# Patient Record
Sex: Female | Born: 1980 | Race: White | Hispanic: No | Marital: Married | Smoking: Former smoker
Health system: Southern US, Community
[De-identification: ages and names within clinical notes are randomized; demographics above are authoritative.]

## PROBLEM LIST (undated history)

## (undated) ENCOUNTER — Inpatient Hospital Stay (HOSPITAL_COMMUNITY): Payer: Self-pay

## (undated) DIAGNOSIS — K219 Gastro-esophageal reflux disease without esophagitis: Secondary | ICD-10-CM

## (undated) DIAGNOSIS — E7800 Pure hypercholesterolemia, unspecified: Secondary | ICD-10-CM

## (undated) DIAGNOSIS — F32A Depression, unspecified: Secondary | ICD-10-CM

## (undated) DIAGNOSIS — O24419 Gestational diabetes mellitus in pregnancy, unspecified control: Secondary | ICD-10-CM

## (undated) DIAGNOSIS — I10 Essential (primary) hypertension: Secondary | ICD-10-CM

## (undated) DIAGNOSIS — E669 Obesity, unspecified: Secondary | ICD-10-CM

## (undated) DIAGNOSIS — F419 Anxiety disorder, unspecified: Secondary | ICD-10-CM

## (undated) DIAGNOSIS — F329 Major depressive disorder, single episode, unspecified: Secondary | ICD-10-CM

## (undated) DIAGNOSIS — J45909 Unspecified asthma, uncomplicated: Secondary | ICD-10-CM

## (undated) DIAGNOSIS — E119 Type 2 diabetes mellitus without complications: Secondary | ICD-10-CM

## (undated) HISTORY — PX: TONSILLECTOMY: SUR1361

## (undated) HISTORY — DX: Unspecified asthma, uncomplicated: J45.909

## (undated) HISTORY — PX: WISDOM TOOTH EXTRACTION: SHX21

---

## 2002-01-05 ENCOUNTER — Emergency Department (HOSPITAL_COMMUNITY): Admission: EM | Admit: 2002-01-05 | Discharge: 2002-01-05 | Payer: Self-pay | Admitting: Emergency Medicine

## 2002-01-05 ENCOUNTER — Encounter: Payer: Self-pay | Admitting: Emergency Medicine

## 2005-04-12 ENCOUNTER — Emergency Department: Payer: Self-pay | Admitting: Emergency Medicine

## 2005-10-08 ENCOUNTER — Observation Stay: Payer: Self-pay | Admitting: Obstetrics and Gynecology

## 2005-10-13 ENCOUNTER — Observation Stay: Payer: Self-pay | Admitting: Obstetrics and Gynecology

## 2005-10-16 ENCOUNTER — Observation Stay: Payer: Self-pay | Admitting: Obstetrics and Gynecology

## 2005-11-07 ENCOUNTER — Inpatient Hospital Stay: Payer: Self-pay | Admitting: Obstetrics and Gynecology

## 2006-04-03 ENCOUNTER — Emergency Department: Payer: Self-pay | Admitting: Unknown Physician Specialty

## 2006-04-03 ENCOUNTER — Other Ambulatory Visit: Payer: Self-pay

## 2006-06-15 ENCOUNTER — Ambulatory Visit: Payer: Self-pay | Admitting: Dermatology

## 2010-03-01 ENCOUNTER — Emergency Department: Payer: Self-pay

## 2012-01-19 ENCOUNTER — Emergency Department: Payer: Self-pay | Admitting: Emergency Medicine

## 2012-07-29 ENCOUNTER — Emergency Department: Payer: Self-pay | Admitting: Emergency Medicine

## 2012-07-29 LAB — COMPREHENSIVE METABOLIC PANEL
Anion Gap: 9 (ref 7–16)
BUN: 10 mg/dL (ref 7–18)
Calcium, Total: 8.5 mg/dL (ref 8.5–10.1)
Chloride: 105 mmol/L (ref 98–107)
Creatinine: 0.77 mg/dL (ref 0.60–1.30)
EGFR (African American): >60
EGFR (Non-African Amer.): >60
Glucose: 184 mg/dL — ABNORMAL HIGH (ref 65–99)
Potassium: 4 mmol/L (ref 3.5–5.1)
SGOT(AST): 58 U/L — ABNORMAL HIGH (ref 15–37)
Sodium: 137 mmol/L (ref 136–145)
Total Protein: 7.1 g/dL (ref 6.4–8.2)

## 2012-07-29 LAB — CBC
HCT: 41 % (ref 35.0–47.0)
HGB: 14.2 g/dL (ref 12.0–16.0)
MCHC: 34.5 g/dL (ref 32.0–36.0)
MCV: 83 fL (ref 80–100)
Platelet: 257 10*3/uL (ref 150–440)
RBC: 4.96 10*6/uL (ref 3.80–5.20)
RDW: 13.6 % (ref 11.5–14.5)

## 2012-07-29 LAB — URINALYSIS, COMPLETE
Bacteria: NONE SEEN
Bilirubin,UR: NEGATIVE
Glucose,UR: 150 mg/dL (ref 0–75)
Ketone: NEGATIVE
Leukocyte Esterase: NEGATIVE
Ph: 5 (ref 4.5–8.0)
RBC,UR: <1 /HPF (ref 0–5)
Specific Gravity: 1.019 (ref 1.003–1.030)
Squamous Epithelial: NONE SEEN
Transitional Epi: <1
WBC UR: NONE SEEN /HPF (ref 0–5)

## 2012-07-29 LAB — PREGNANCY, URINE: Pregnancy Test, Urine: NEGATIVE m[IU]/mL

## 2012-07-29 LAB — CK: CK, Total: 45 U/L (ref 21–215)

## 2013-07-10 ENCOUNTER — Emergency Department (HOSPITAL_COMMUNITY)
Admission: EM | Admit: 2013-07-10 | Discharge: 2013-07-10 | Disposition: A | Discharge status: Home / Self Care | Payer: No Typology Code available for payment source | Attending: Emergency Medicine | Admitting: Emergency Medicine

## 2013-07-10 ENCOUNTER — Encounter (HOSPITAL_COMMUNITY): Payer: Self-pay | Admitting: Emergency Medicine

## 2013-07-10 ENCOUNTER — Emergency Department (HOSPITAL_COMMUNITY): Payer: No Typology Code available for payment source

## 2013-07-10 DIAGNOSIS — Z79899 Other long term (current) drug therapy: Secondary | ICD-10-CM | POA: Insufficient documentation

## 2013-07-10 DIAGNOSIS — S0993XA Unspecified injury of face, initial encounter: Secondary | ICD-10-CM | POA: Insufficient documentation

## 2013-07-10 DIAGNOSIS — E119 Type 2 diabetes mellitus without complications: Secondary | ICD-10-CM | POA: Insufficient documentation

## 2013-07-10 DIAGNOSIS — F17200 Nicotine dependence, unspecified, uncomplicated: Secondary | ICD-10-CM | POA: Insufficient documentation

## 2013-07-10 DIAGNOSIS — Z3202 Encounter for pregnancy test, result negative: Secondary | ICD-10-CM | POA: Insufficient documentation

## 2013-07-10 DIAGNOSIS — Y9389 Activity, other specified: Secondary | ICD-10-CM | POA: Insufficient documentation

## 2013-07-10 DIAGNOSIS — Y92410 Unspecified street and highway as the place of occurrence of the external cause: Secondary | ICD-10-CM | POA: Insufficient documentation

## 2013-07-10 HISTORY — DX: Type 2 diabetes mellitus without complications: E11.9

## 2013-07-10 LAB — POCT PREGNANCY, URINE: Preg Test, Ur: NEGATIVE

## 2013-07-10 MED ORDER — OXYCODONE-ACETAMINOPHEN 5-325 MG PO TABS: 2.0000 | ORAL_TABLET | ORAL | Status: DC | PRN

## 2013-07-10 MED ORDER — METHOCARBAMOL 500 MG PO TABS: 500.0000 mg | ORAL_TABLET | Freq: Two times a day (BID) | ORAL | Status: DC
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
## 2013-07-10 NOTE — ED Notes (Signed)
Pt returned from CT °

## 2013-11-04 ENCOUNTER — Encounter: Payer: Self-pay | Admitting: Obstetrics

## 2013-11-04 ENCOUNTER — Ambulatory Visit (INDEPENDENT_AMBULATORY_CARE_PROVIDER_SITE_OTHER): Payer: BC Managed Care – PPO | Admitting: Obstetrics

## 2013-11-04 VITALS — BP 113/77 | HR 83 | Temp 97.3°F | Ht 67.0 in | Wt 298.0 lb

## 2013-11-04 DIAGNOSIS — Z7251 High risk heterosexual behavior: Secondary | ICD-10-CM

## 2013-11-04 DIAGNOSIS — Z113 Encounter for screening for infections with a predominantly sexual mode of transmission: Secondary | ICD-10-CM

## 2013-11-04 DIAGNOSIS — O200 Threatened abortion: Secondary | ICD-10-CM | POA: Insufficient documentation

## 2013-11-04 LAB — POCT URINALYSIS DIPSTICK
Bilirubin, UA: NEGATIVE
Blood, UA: NEGATIVE
Ketones, UA: NEGATIVE
Nitrite, UA: NEGATIVE
pH, UA: 5

## 2013-11-04 NOTE — Progress Notes (Signed)
Subjective:     April Chan is a 32 y.o. female here for a problem visit.  Current complaints: irregular bleeding, cramping, positive home pregnancy test.  Personal health questionnaire reviewed: yes.   Gynecologic History Patient's last menstrual period was 08/15/2013. Contraception: none Last Pap: 2012. Results were: normal Last mammogram: N/A  Obstetric History OB History  No data available     The following portions of the patient's history were reviewed and updated as appropriate: allergies, current medications, past family history, past medical history, past social history, past surgical history and problem list.  Review of Systems Pertinent items are noted in HPI.    Objective:    General appearance: alert and no distress Abdomen: normal findings: soft, non-tender Pelvic: cervix normal in appearance, external genitalia normal, no adnexal masses or tenderness, no cervical motion tenderness, rectovaginal septum normal, uterus normal size, shape, and consistency and vagina normal without discharge Extremities: extremities normal, atraumatic, no cyanosis or edema    Assessment:    Healthy female exam.   Threatened Abortion.   Plan:    Follow up in: 3 days. Repeat Quantitative beta-HCG in 72 hrs.

## 2013-11-05 LAB — WET PREP BY MOLECULAR PROBE
Candida species: NEGATIVE
Gardnerella vaginalis: NEGATIVE

## 2013-11-05 LAB — GC/CHLAMYDIA PROBE AMP: GC Probe RNA: NEGATIVE

## 2013-11-05 LAB — HCG, QUANTITATIVE, PREGNANCY: hCG, Beta Chain, Quant, S: 100284.2 m[IU]/mL

## 2013-11-07 ENCOUNTER — Other Ambulatory Visit: Payer: BC Managed Care – PPO

## 2013-11-07 DIAGNOSIS — O200 Threatened abortion: Secondary | ICD-10-CM

## 2013-11-08 ENCOUNTER — Other Ambulatory Visit: Payer: Self-pay | Admitting: Obstetrics

## 2013-11-08 DIAGNOSIS — O200 Threatened abortion: Secondary | ICD-10-CM

## 2013-11-08 LAB — HCG, QUANTITATIVE, PREGNANCY: hCG, Beta Chain, Quant, S: 110596.3 m[IU]/mL

## 2013-11-09 ENCOUNTER — Ambulatory Visit (HOSPITAL_COMMUNITY)
Admission: RE | Admit: 2013-11-09 | Discharge: 2013-11-09 | Disposition: A | Discharge status: Home / Self Care | Payer: BC Managed Care – PPO | Source: Ambulatory Visit | Attending: Obstetrics | Admitting: Obstetrics

## 2013-11-09 DIAGNOSIS — O200 Threatened abortion: Secondary | ICD-10-CM | POA: Insufficient documentation

## 2013-11-09 DIAGNOSIS — Z3689 Encounter for other specified antenatal screening: Secondary | ICD-10-CM | POA: Insufficient documentation

## 2013-12-01 ENCOUNTER — Encounter: Payer: Self-pay | Admitting: Obstetrics

## 2013-12-01 ENCOUNTER — Ambulatory Visit (INDEPENDENT_AMBULATORY_CARE_PROVIDER_SITE_OTHER): Payer: BC Managed Care – PPO | Admitting: Obstetrics

## 2013-12-01 VITALS — BP 113/77 | Temp 98.2°F | Wt 297.0 lb

## 2013-12-01 DIAGNOSIS — Z3201 Encounter for pregnancy test, result positive: Secondary | ICD-10-CM

## 2013-12-01 DIAGNOSIS — Z3481 Encounter for supervision of other normal pregnancy, first trimester: Secondary | ICD-10-CM

## 2013-12-01 DIAGNOSIS — E119 Type 2 diabetes mellitus without complications: Secondary | ICD-10-CM

## 2013-12-01 DIAGNOSIS — E139 Other specified diabetes mellitus without complications: Secondary | ICD-10-CM

## 2013-12-01 DIAGNOSIS — O219 Vomiting of pregnancy, unspecified: Secondary | ICD-10-CM

## 2013-12-01 LAB — POCT URINALYSIS DIPSTICK
Blood, UA: NEGATIVE
Spec Grav, UA: >=1.03
Urobilinogen, UA: NEGATIVE

## 2013-12-01 MED ORDER — METFORMIN HCL 500 MG PO TABS: 500.0000 mg | ORAL_TABLET | Freq: Two times a day (BID) | ORAL | Status: DC

## 2013-12-01 MED ORDER — PROMETHAZINE HCL 25 MG PO TABS: 25.0000 mg | ORAL_TABLET | Freq: Four times a day (QID) | ORAL | Status: DC | PRN

## 2013-12-01 NOTE — Progress Notes (Signed)
HR - 83 Pt in office today for Initial OB visit, concerned about being diabetic and previous child being large at birth Subjective:    April Chan is being seen today for her first obstetrical visit.  This is a planned pregnancy. She is at [redacted]w[redacted]d gestation. Her obstetrical history is significant for diabetic. Relationship with FOB: significant other, living together. Patient does intend to breast feed. Pregnancy history fully reviewed.  Menstrual History: OB History   Grav Para Term Preterm Abortions TAB SAB Ect Mult Living   2 1 1       1       Menarche age: not asked  Patient's last menstrual period was 08/15/2013.    The following portions of the patient's history were reviewed and updated as appropriate: allergies, current medications, past family history, past medical history, past social history, past surgical history and problem list.  Review of Systems Pertinent items are noted in HPI.    Objective:    General appearance: alert and no distress Abdomen: normal findings: soft, non-tender Pelvic: cervix normal in appearance, external genitalia normal, no cervical motion tenderness, rectovaginal septum normal and vagina normal without discharge    Assessment:    Pregnancy at [redacted]w[redacted]d weeks    Plan:    Initial labs drawn. Prenatal vitamins.  Counseling provided regarding continued use of seat belts, cessation of alcohol consumption, smoking or use of illicit drugs; infection precautions i.e., influenza/TDAP immunizations, toxoplasmosis,CMV, parvovirus, listeria and varicella; workplace safety, exercise during pregnancy; routine dental care, safe medications, sexual activity, hot tubs, saunas, pools, travel, caffeine use, fish and methlymercury, potential toxins, hair treatments, varicose veins Weight gain recommendations per IOM guidelines reviewed: underweight/BMI< 18.5--> gain 28 - 40 lbs; normal weight/BMI 18.5 - 24.9--> gain 25 - 35 lbs; overweight/BMI 25 - 29.9--> gain 15 -  25 lbs; obese/BMI >30->gain  11 - 20 lbs Problem list reviewed and updated. FIRST/CF mutation testing/NIPT/QUAD SCREEN discussed: requested. Role of ultrasound in pregnancy discussed; fetal survey: requested. Amniocentesis discussed: not indicated. VBAC calculator score: VBAC consent form provided Follow up in 4 weeks. 50% of 20 min visit spent on counseling and coordination of care.

## 2013-12-02 ENCOUNTER — Encounter: Payer: Self-pay | Admitting: Obstetrics

## 2013-12-02 LAB — HIV ANTIBODY (ROUTINE TESTING W REFLEX): HIV: NONREACTIVE

## 2013-12-02 LAB — OBSTETRIC PANEL
Basophils Absolute: 0 10*3/uL (ref 0.0–0.1)
Basophils Relative: 0 % (ref 0–1)
Eosinophils Absolute: 0.1 10*3/uL (ref 0.0–0.7)
Eosinophils Relative: 1 % (ref 0–5)
MCH: 28.8 pg (ref 26.0–34.0)
MCHC: 35 g/dL (ref 30.0–36.0)
MCV: 82.3 fL (ref 78.0–100.0)
Platelets: 255 10*3/uL (ref 150–400)
RDW: 14.3 % (ref 11.5–15.5)

## 2013-12-02 LAB — WET PREP BY MOLECULAR PROBE
Candida species: NEGATIVE
Trichomonas vaginosis: NEGATIVE

## 2013-12-02 LAB — PAP IG W/ RFLX HPV ASCU

## 2013-12-03 LAB — CULTURE, OB URINE

## 2013-12-05 LAB — HEMOGLOBINOPATHY EVALUATION
Hgb A: 97.5 % (ref 96.8–97.8)
Hgb F Quant: 0 % (ref 0.0–2.0)
Hgb S Quant: 0 %

## 2013-12-06 ENCOUNTER — Other Ambulatory Visit: Payer: Self-pay | Admitting: *Deleted

## 2013-12-06 DIAGNOSIS — B9689 Other specified bacterial agents as the cause of diseases classified elsewhere: Secondary | ICD-10-CM

## 2013-12-06 MED ORDER — METRONIDAZOLE 500 MG PO TABS: 500.0000 mg | ORAL_TABLET | Freq: Two times a day (BID) | ORAL | Status: DC

## 2013-12-21 ENCOUNTER — Other Ambulatory Visit: Payer: Self-pay | Admitting: *Deleted

## 2013-12-21 DIAGNOSIS — B379 Candidiasis, unspecified: Secondary | ICD-10-CM

## 2013-12-21 MED ORDER — TERCONAZOLE 0.4 % VA CREA: 1.0000 | TOPICAL_CREAM | Freq: Every day | VAGINAL | Status: DC

## 2013-12-29 ENCOUNTER — Other Ambulatory Visit: Payer: Self-pay | Admitting: Obstetrics

## 2013-12-29 ENCOUNTER — Encounter: Payer: BC Managed Care – PPO | Admitting: Obstetrics

## 2013-12-29 ENCOUNTER — Ambulatory Visit (INDEPENDENT_AMBULATORY_CARE_PROVIDER_SITE_OTHER): Payer: BC Managed Care – PPO | Admitting: Obstetrics

## 2013-12-29 ENCOUNTER — Encounter: Payer: Self-pay | Admitting: Obstetrics

## 2013-12-29 VITALS — BP 116/77 | Temp 98.0°F | Wt 292.0 lb

## 2013-12-29 DIAGNOSIS — Z3689 Encounter for other specified antenatal screening: Secondary | ICD-10-CM

## 2013-12-29 DIAGNOSIS — Z3482 Encounter for supervision of other normal pregnancy, second trimester: Secondary | ICD-10-CM

## 2013-12-29 DIAGNOSIS — B3731 Acute candidiasis of vulva and vagina: Secondary | ICD-10-CM | POA: Insufficient documentation

## 2013-12-29 DIAGNOSIS — B373 Candidiasis of vulva and vagina: Secondary | ICD-10-CM | POA: Insufficient documentation

## 2013-12-29 DIAGNOSIS — Z3480 Encounter for supervision of other normal pregnancy, unspecified trimester: Secondary | ICD-10-CM

## 2013-12-29 DIAGNOSIS — O24419 Gestational diabetes mellitus in pregnancy, unspecified control: Secondary | ICD-10-CM

## 2013-12-29 DIAGNOSIS — O99810 Abnormal glucose complicating pregnancy: Secondary | ICD-10-CM

## 2013-12-29 LAB — POCT URINALYSIS DIPSTICK
Bilirubin, UA: NEGATIVE
NITRITE UA: NEGATIVE
RBC UA: NEGATIVE
UROBILINOGEN UA: NEGATIVE
pH, UA: 5

## 2013-12-29 MED ORDER — FLUCONAZOLE 150 MG PO TABS: 150.0000 mg | ORAL_TABLET | Freq: Once | ORAL | Status: DC

## 2013-12-29 NOTE — Progress Notes (Signed)
HR - 93 Pt in office today for routine OB visit, reports still having yeast symptoms, reports low abd cramping, needs refill for acyclovir

## 2014-01-02 ENCOUNTER — Other Ambulatory Visit: Payer: Self-pay | Admitting: Obstetrics

## 2014-01-05 ENCOUNTER — Ambulatory Visit (HOSPITAL_COMMUNITY): Payer: No Typology Code available for payment source

## 2014-01-12 ENCOUNTER — Ambulatory Visit (HOSPITAL_COMMUNITY)
Admission: RE | Admit: 2014-01-12 | Discharge: 2014-01-12 | Disposition: A | Discharge status: Home / Self Care | Payer: BC Managed Care – PPO | Source: Ambulatory Visit | Attending: Obstetrics | Admitting: Obstetrics

## 2014-01-12 ENCOUNTER — Encounter (HOSPITAL_COMMUNITY): Payer: Self-pay

## 2014-01-12 NOTE — ED Notes (Signed)
  Patient was seen on 01/12/14 for Diabetes self-management Education.  Gertrue has a DX of T2DM for 3-67yr. She has had NO prior DM education. She has a profound family history of IDDM. Her grandmother died due to complications of TO5GW Prior to pregnancy MSharadawas not testing her glucose and was eating at will (2 apples at breakfast). She believes her last A1c was about 10%. She had a PCP that was prescribing her medication of Metformin 5066mBID but said he would no longer do so until she came into the office. Dr. HaJodi Mournings now writing for her medications. MaRosioas been testing her glucose 2hpp with NO FBS. Her readings range 170-24039ml. MarPayton financially challenged. She and her partner work opposite schedules and have only one car. She is dependent on others to come to medical appointments.  The following learning objectives were met by the patient :  States why dietary management is important in controlling blood glucose  Describes the effects of carbohydrates on blood glucose levels  Demonstrates ability to create a balanced meal plan  Demonstrates carbohydrate counting   States when to check blood glucose levels  Demonstrates proper blood glucose monitoring techniques  States the effect of stress and exercise on blood glucose levels  Plan:  Aim for 2 Carb Choices per meal (30 grams) +/- 1 either way for breakfast Aim for 3 Carb Choices per meal (45 grams) +/- 1 either way from lunch and dinner Aim for 1-2 Carbs per snack Begin reading food labels for Total Carbohydrate and sugar grams of foods Consider  increasing your activity level by walking daily as tolerated Begin checking BG before breakfast and 2 hours after first bite of breakfast, lunch and dinner as directed by MD  Take medication  as directed by MD  Patient instructed to monitor glucose levels: FBS: 60 - <90 2 hour: <120  Patient received the following handouts:  Nutrition Diabetes and Pregnancy  Carbohydrate  Counting List  Meal Planning worksheet  Patient will be seen tomorrow for ultrasound. I would like to see patient in 1 week to monitor glucose readings and evaluate need for medication modification. I would recommend a current A1c.

## 2014-01-13 ENCOUNTER — Ambulatory Visit (HOSPITAL_COMMUNITY)
Admission: RE | Admit: 2014-01-13 | Discharge: 2014-01-13 | Disposition: A | Discharge status: Home / Self Care | Payer: BC Managed Care – PPO | Source: Ambulatory Visit | Attending: Obstetrics | Admitting: Obstetrics

## 2014-01-13 ENCOUNTER — Ambulatory Visit (HOSPITAL_COMMUNITY)
Admission: RE | Admit: 2014-01-13 | Discharge: 2014-01-13 | Disposition: A | Discharge status: Home / Self Care | Payer: BC Managed Care – PPO | Source: Ambulatory Visit | Attending: Family Medicine | Admitting: Family Medicine

## 2014-01-13 DIAGNOSIS — O358XX0 Maternal care for other (suspected) fetal abnormality and damage, not applicable or unspecified: Secondary | ICD-10-CM | POA: Insufficient documentation

## 2014-01-13 DIAGNOSIS — O24419 Gestational diabetes mellitus in pregnancy, unspecified control: Secondary | ICD-10-CM

## 2014-01-13 DIAGNOSIS — Z1389 Encounter for screening for other disorder: Secondary | ICD-10-CM | POA: Insufficient documentation

## 2014-01-13 DIAGNOSIS — Z363 Encounter for antenatal screening for malformations: Secondary | ICD-10-CM | POA: Insufficient documentation

## 2014-01-13 DIAGNOSIS — O24919 Unspecified diabetes mellitus in pregnancy, unspecified trimester: Secondary | ICD-10-CM | POA: Insufficient documentation

## 2014-01-13 DIAGNOSIS — Z3689 Encounter for other specified antenatal screening: Secondary | ICD-10-CM

## 2014-01-13 LAB — COMPREHENSIVE METABOLIC PANEL
ALBUMIN: 3.2 g/dL — AB (ref 3.5–5.2)
ALT: 9 U/L (ref 0–35)
AST: 11 U/L (ref 0–37)
Alkaline Phosphatase: 40 U/L (ref 39–117)
BUN: 7 mg/dL (ref 6–23)
CALCIUM: 8.9 mg/dL (ref 8.4–10.5)
CO2: 22 mEq/L (ref 19–32)
CREATININE: 0.45 mg/dL — AB (ref 0.50–1.10)
Chloride: 101 mEq/L (ref 96–112)
GFR calc non Af Amer: >90 mL/min (ref >90)
GLUCOSE: 126 mg/dL — AB (ref 70–99)
Potassium: 3.7 mEq/L (ref 3.7–5.3)
Sodium: 136 mEq/L — ABNORMAL LOW (ref 137–147)
TOTAL PROTEIN: 6.4 g/dL (ref 6.0–8.3)
Total Bilirubin: 0.3 mg/dL (ref 0.3–1.2)

## 2014-01-13 LAB — HEMOGLOBIN A1C
HEMOGLOBIN A1C: 5.9 % — AB (ref <5.7)
MEAN PLASMA GLUCOSE: 123 mg/dL — AB (ref <117)

## 2014-01-13 NOTE — Consult Note (Signed)
Maternal Fetal Medicine Consultation  Requesting Provider(s): Coral Ceoharles Harper, MD  Reason for consultation: Pre-gestational diabetes  HPI: April PeersMary L Chan is a 33 yo G2P1001 currently at 217 5/7 weeks, EDD 06/18/2013 who was seen for consultation due to pre gestational diabetes.  She reports that she was given the diagnosis of diabetes about 3 years ago and has been treated with Metformin since that time.  She has not had a recent HbA1C drawn.  The patient is unaware of any end-organ disease but reports a strong family history of diabetes - she reports that her sister experienced an MI at the age of 32 and that "everyone" on her father's side has diabetes.  The patient reports that she has been checking her fingerstick values - they are frequently in the 130-200 range.  She was seen for diabetes education yesterday and instructed on a diabetic diet.  Her fasting value this morning was 109.  The patient's past OB history is remarkable for a prior 9# SVD without complications.  She is without complaints today.  OB History: OB History   Grav Para Term Preterm Abortions TAB SAB Ect Mult Living   2 1 1       1       PMH:  Past Medical History  Diagnosis Date  . Diabetes mellitus without complication   . Asthma     PSH:  Past Surgical History  Procedure Laterality Date  . Tonsillectomy    . Wisdom tooth extraction Bilateral    Meds:  Current Outpatient Prescriptions on File Prior to Encounter  Medication Sig Dispense Refill  . acyclovir (ZOVIRAX) 200 MG capsule Take 200 mg by mouth daily.      . fluconazole (DIFLUCAN) 150 MG tablet Take 1 tablet (150 mg total) by mouth once.  1 tablet  0  . metFORMIN (GLUCOPHAGE) 500 MG tablet Take 1 tablet (500 mg total) by mouth 2 (two) times daily with a meal.  60 tablet  11  . omeprazole (PRILOSEC) 20 MG capsule Take 20 mg by mouth daily.      . Prenatal Vit-Fe Fumarate-FA (MULTIVITAMIN-PRENATAL) 27-0.8 MG TABS tablet Take 1 tablet by mouth daily at 12  noon.      . promethazine (PHENERGAN) 25 MG tablet Take 1 tablet (25 mg total) by mouth every 6 (six) hours as needed for nausea or vomiting.  30 tablet  1  . terconazole (TERAZOL 7) 0.4 % vaginal cream Place 1 applicator vaginally at bedtime.  45 g  0  . valACYclovir (VALTREX) 500 MG tablet TAKE 1 TABLET BY MOUTH ONCE A DAY PATIENT NEEDS TO CALL THE OFFICE FOR AN APPOINTMENT  90 tablet  0   No current facility-administered medications on file prior to encounter.   Allergies: No Known Allergies  FH:  Family History  Problem Relation Age of Onset  . Polycystic kidney disease Mother   . Hypertension Father   . Diabetes Father   . Heart disease Maternal Grandfather   . Diabetes Paternal Grandmother   . Hypertension Paternal Grandmother   . Cancer Paternal Grandmother   . Diabetes Paternal Grandfather   . Heart disease Paternal Grandfather    Soc:  History  Smoking status  . Former Smoker  Smokeless tobacco  . Not on file   History  Alcohol Use No    Comment: occasional    Review of Systems: no vaginal bleeding or cramping/contractions, no LOF, no nausea/vomiting. All other systems reviewed and are negative.  PE:  Wt: 290#,  108/62, 90  GEN: well-appearing female ABD: gravid, NT  Ultrasound: Single IUP at 17 5/7 weeks Pre gestational diabetes Limited views of the fetal anatomy obtaine due to maternal body habitus No gross anomalies noted Normal amniotic fluid volume  A/P: 1) Single IUP at 17 5/7 weeks         2) Morbid obesity         3) Class B(type 2) diabetes - discussed risks associated with diabetes to include fetal macrosomia, birth injury, birth defects that appear to be associated with the degree of glucose control at the time of conception, risk of still birth.  We discussed the need for tight control during pregnancy. Based on her baseline glucose values, feel that she would benefit from insulin.  She will also need a more thorough work up that does not appear  to have happened up to this point in her disease process.  Recommend: 1) Baseline labs - HbA1c, CMP, 24-hr urine for protein and Creat Clearance 2) Recommend evaluation of the eye grounds to rule out diabetic retinopathy 3) Prescription given for insulin - recommend starting dose of 0.7 units/kg - calculated dose:  40 units NPH in the AM 15 units NPH in the PM 10 units of  Insulin Aspart (Humalog) at breakfast, lunch and dinner  The patient is scheduled for follow up with our diabetic educator next week - will review glucose values again prior to starting medications - will have insulin teaching at that time.  Will need to do 4x daily fingerstick values - fasting and 2 hr post prandial - will adjust insulin to maintain fasting values in the 60-90 range and post prandial values < 130.  4) Follow up ultrasound in 4 weeks to reevaluate fetal anatomy 5) Fetal echo in 6 weeks 6) Serial growth scans in the 3rd trimester 7) Antepartum fetal testing (2x weekly NSTs with weekly AFIs) beginning at 32 weeks 8) Would move toward delivery at 39 weeks if otherwise stable.    Thank you for the opportunity to be a part of the care of April Chan. Please contact our office if we can be of further assistance.   I spent approximately 30 minutes with this patient with over 50% of time spent in face-to-face counseling.  Alpha Gula, MD Maternal Fetal Medicine

## 2014-01-16 ENCOUNTER — Telehealth (HOSPITAL_COMMUNITY): Payer: Self-pay | Admitting: *Deleted

## 2014-01-16 NOTE — Telephone Encounter (Signed)
Called patient with lab results.  Notified of A1C of 5.9.  Patient verbalized understanding.  Pt has appt Thursday with diabetic educator.

## 2014-01-19 ENCOUNTER — Ambulatory Visit (HOSPITAL_COMMUNITY): Payer: BC Managed Care – PPO

## 2014-01-25 ENCOUNTER — Encounter: Payer: Self-pay | Admitting: Obstetrics

## 2014-01-25 ENCOUNTER — Ambulatory Visit (INDEPENDENT_AMBULATORY_CARE_PROVIDER_SITE_OTHER): Payer: BC Managed Care – PPO | Admitting: Obstetrics

## 2014-01-25 VITALS — BP 112/74 | Temp 97.8°F | Wt 295.0 lb

## 2014-01-25 DIAGNOSIS — Z3480 Encounter for supervision of other normal pregnancy, unspecified trimester: Secondary | ICD-10-CM

## 2014-01-25 LAB — POCT URINALYSIS DIPSTICK
Bilirubin, UA: NEGATIVE
Blood, UA: NEGATIVE
Ketones, UA: NEGATIVE
LEUKOCYTES UA: NEGATIVE
NITRITE UA: NEGATIVE
PROTEIN UA: NEGATIVE
SPEC GRAV UA: 1.025
UROBILINOGEN UA: NEGATIVE
pH, UA: 5

## 2014-01-25 NOTE — Progress Notes (Signed)
Pulse: 93 Patient states she is having some pelvic cramping. Patient states she is having a constant dull pain in her right lower back pain that radiates down her thigh and causes numbness when she sits and then numbness worsens when she lays down. Patient states when she went for her ultrasound that the doctor wanted to put her on insulin but she hasn't started using it yet and would like Dr. Thomes LollingHarpers opinion.

## 2014-01-26 ENCOUNTER — Inpatient Hospital Stay (HOSPITAL_COMMUNITY): Admission: RE | Admit: 2014-01-26 | Payer: BC Managed Care – PPO | Source: Ambulatory Visit

## 2014-01-26 ENCOUNTER — Encounter: Payer: BC Managed Care – PPO | Admitting: Obstetrics

## 2014-02-10 ENCOUNTER — Ambulatory Visit (HOSPITAL_COMMUNITY)
Admission: RE | Admit: 2014-02-10 | Discharge: 2014-02-10 | Disposition: A | Discharge status: Home / Self Care | Payer: BC Managed Care – PPO | Source: Ambulatory Visit | Attending: Obstetrics | Admitting: Obstetrics

## 2014-02-10 ENCOUNTER — Other Ambulatory Visit: Payer: Self-pay | Admitting: Obstetrics

## 2014-02-10 ENCOUNTER — Ambulatory Visit (HOSPITAL_COMMUNITY): Payer: No Typology Code available for payment source

## 2014-02-10 DIAGNOSIS — O24919 Unspecified diabetes mellitus in pregnancy, unspecified trimester: Secondary | ICD-10-CM | POA: Insufficient documentation

## 2014-02-10 DIAGNOSIS — Z0489 Encounter for examination and observation for other specified reasons: Secondary | ICD-10-CM

## 2014-02-10 DIAGNOSIS — IMO0002 Reserved for concepts with insufficient information to code with codable children: Secondary | ICD-10-CM

## 2014-02-10 DIAGNOSIS — Z3689 Encounter for other specified antenatal screening: Secondary | ICD-10-CM | POA: Insufficient documentation

## 2014-02-15 ENCOUNTER — Encounter: Payer: Self-pay | Admitting: Obstetrics

## 2014-02-20 ENCOUNTER — Ambulatory Visit (INDEPENDENT_AMBULATORY_CARE_PROVIDER_SITE_OTHER): Payer: BC Managed Care – PPO | Admitting: Obstetrics

## 2014-02-20 ENCOUNTER — Encounter: Payer: Self-pay | Admitting: Obstetrics

## 2014-02-20 VITALS — BP 110/78 | Temp 98.0°F | Wt 293.0 lb

## 2014-02-20 DIAGNOSIS — Z3480 Encounter for supervision of other normal pregnancy, unspecified trimester: Secondary | ICD-10-CM

## 2014-02-20 LAB — POCT URINALYSIS DIPSTICK
BILIRUBIN UA: NEGATIVE
Blood, UA: NEGATIVE
Glucose, UA: NEGATIVE
KETONES UA: NEGATIVE
Leukocytes, UA: NEGATIVE
Nitrite, UA: NEGATIVE
PH UA: 5
PROTEIN UA: NEGATIVE
Spec Grav, UA: 1.025
Urobilinogen, UA: NEGATIVE

## 2014-02-20 NOTE — Progress Notes (Signed)
Pulse: 92

## 2014-02-27 ENCOUNTER — Encounter: Payer: Self-pay | Admitting: Obstetrics

## 2014-03-03 ENCOUNTER — Encounter: Payer: Self-pay | Admitting: Obstetrics

## 2014-03-20 ENCOUNTER — Ambulatory Visit (INDEPENDENT_AMBULATORY_CARE_PROVIDER_SITE_OTHER): Payer: BC Managed Care – PPO | Admitting: Obstetrics

## 2014-03-20 VITALS — BP 108/73 | Temp 97.6°F | Wt 299.0 lb

## 2014-03-20 DIAGNOSIS — Z3480 Encounter for supervision of other normal pregnancy, unspecified trimester: Secondary | ICD-10-CM

## 2014-03-20 LAB — POCT URINALYSIS DIPSTICK
BILIRUBIN UA: NEGATIVE
GLUCOSE UA: NEGATIVE
Ketones, UA: NEGATIVE
Leukocytes, UA: NEGATIVE
Nitrite, UA: NEGATIVE
PH UA: 5
RBC UA: NEGATIVE
Urobilinogen, UA: NEGATIVE
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
## 2014-03-22 ENCOUNTER — Other Ambulatory Visit: Payer: Self-pay | Admitting: Obstetrics

## 2014-03-22 ENCOUNTER — Other Ambulatory Visit: Payer: Self-pay

## 2014-03-22 DIAGNOSIS — O24419 Gestational diabetes mellitus in pregnancy, unspecified control: Secondary | ICD-10-CM

## 2014-03-24 ENCOUNTER — Ambulatory Visit (HOSPITAL_COMMUNITY)
Admission: RE | Admit: 2014-03-24 | Discharge: 2014-03-24 | Disposition: A | Discharge status: Home / Self Care | Payer: BC Managed Care – PPO | Source: Ambulatory Visit | Attending: Obstetrics | Admitting: Obstetrics

## 2014-03-24 DIAGNOSIS — O24919 Unspecified diabetes mellitus in pregnancy, unspecified trimester: Secondary | ICD-10-CM | POA: Insufficient documentation

## 2014-03-24 DIAGNOSIS — O24419 Gestational diabetes mellitus in pregnancy, unspecified control: Secondary | ICD-10-CM

## 2014-03-25 ENCOUNTER — Encounter: Payer: Self-pay | Admitting: Obstetrics

## 2014-03-30 ENCOUNTER — Other Ambulatory Visit: Payer: Self-pay | Admitting: Obstetrics

## 2014-03-30 DIAGNOSIS — O24919 Unspecified diabetes mellitus in pregnancy, unspecified trimester: Secondary | ICD-10-CM

## 2014-04-03 ENCOUNTER — Encounter: Payer: BC Managed Care – PPO | Admitting: Obstetrics

## 2014-04-03 ENCOUNTER — Other Ambulatory Visit: Payer: Self-pay | Admitting: *Deleted

## 2014-04-03 DIAGNOSIS — O200 Threatened abortion: Secondary | ICD-10-CM

## 2014-04-03 DIAGNOSIS — O24919 Unspecified diabetes mellitus in pregnancy, unspecified trimester: Secondary | ICD-10-CM

## 2014-04-04 ENCOUNTER — Other Ambulatory Visit: Payer: Self-pay | Admitting: *Deleted

## 2014-04-04 ENCOUNTER — Other Ambulatory Visit: Payer: BC Managed Care – PPO

## 2014-04-04 ENCOUNTER — Encounter: Payer: Self-pay | Admitting: Obstetrics

## 2014-04-04 DIAGNOSIS — B001 Herpesviral vesicular dermatitis: Secondary | ICD-10-CM

## 2014-04-04 MED ORDER — VALACYCLOVIR HCL 500 MG PO TABS: ORAL_TABLET | ORAL | Status: DC

## 2014-04-05 ENCOUNTER — Ambulatory Visit (INDEPENDENT_AMBULATORY_CARE_PROVIDER_SITE_OTHER): Payer: BC Managed Care – PPO | Admitting: Obstetrics

## 2014-04-05 ENCOUNTER — Telehealth (HOSPITAL_COMMUNITY): Payer: Self-pay | Admitting: *Deleted

## 2014-04-05 ENCOUNTER — Encounter: Payer: Self-pay | Admitting: Obstetrics

## 2014-04-05 VITALS — BP 106/65 | Wt 297.0 lb

## 2014-04-05 DIAGNOSIS — O24919 Unspecified diabetes mellitus in pregnancy, unspecified trimester: Secondary | ICD-10-CM | POA: Insufficient documentation

## 2014-04-05 DIAGNOSIS — J309 Allergic rhinitis, unspecified: Secondary | ICD-10-CM

## 2014-04-05 DIAGNOSIS — J302 Other seasonal allergic rhinitis: Secondary | ICD-10-CM | POA: Insufficient documentation

## 2014-04-05 DIAGNOSIS — Z3480 Encounter for supervision of other normal pregnancy, unspecified trimester: Secondary | ICD-10-CM

## 2014-04-05 LAB — POCT URINALYSIS DIPSTICK
BILIRUBIN UA: NEGATIVE
Blood, UA: NEGATIVE
GLUCOSE UA: NEGATIVE
Leukocytes, UA: NEGATIVE
Nitrite, UA: NEGATIVE
Protein, UA: NEGATIVE
SPEC GRAV UA: 1.025
UROBILINOGEN UA: NEGATIVE
pH, UA: 5

## 2014-04-05 MED ORDER — LORATADINE 10 MG PO TABS: 10.0000 mg | ORAL_TABLET | Freq: Every day | ORAL | Status: DC

## 2014-04-05 NOTE — Progress Notes (Signed)
Pulse 116 Pt states that she has had headache for the past 3 days.  Pt states that she has had N&V for the past day.  Pt states that she is having increase GI discomfort.  Pt states that her blood sugar has been steadily increasing.  Pt states that highest BG reading 3 hour post parandial was 222.  Pt states that she has a small lump on left side of abdomen.  Pt states that she has also had a head cold and not sure of what to take.

## 2014-04-05 NOTE — Telephone Encounter (Signed)
Corrie DandyMary called today with concerns about her blood sugars.  She states that her sugars have been fine until recently.  After reviewing her chart and talking with pt, she was supposed to be started on insulin after having a consult with our physician, but discussed it with Dr. Clearance CootsHarper and decided against it.  Corrie DandyMary states that her sugar last night was 222, and 185 fasting this morning.  She also states she has had nausea and vomiting and a head ache recently.  Since Corrie DandyMary has been reviewing her blood sugars with Dr. Clearance CootsHarper, I instructed patient to call his office to notify them of her recent increase in blood sugars and also her vomiting and head aches.  I explained that they may need to see her sooner for a prenatal visit, as we are unable to do prenatal care here in our MFM  Office.  I also [facility] to [facility] that they may want her to come to [facility] to be monitored.  We discussed that if she is unable to be seen by her office that she could go to [facility] to be evaluated.  Pt verbalized understanding.

## 2014-04-06 ENCOUNTER — Encounter: Payer: BC Managed Care – PPO | Admitting: Obstetrics

## 2014-04-07 ENCOUNTER — Ambulatory Visit (HOSPITAL_COMMUNITY)
Admission: RE | Admit: 2014-04-07 | Discharge: 2014-04-07 | Disposition: A | Discharge status: Home / Self Care | Payer: BC Managed Care – PPO | Source: Ambulatory Visit | Attending: Obstetrics | Admitting: Obstetrics

## 2014-04-07 ENCOUNTER — Other Ambulatory Visit: Payer: Self-pay | Admitting: Obstetrics

## 2014-04-07 DIAGNOSIS — O24919 Unspecified diabetes mellitus in pregnancy, unspecified trimester: Secondary | ICD-10-CM

## 2014-04-07 MED ORDER — BD GETTING STARTED TAKE HOME KIT: 1/2ML X 30G SYRINGES
1.0000 | Freq: Once | Status: DC
  Filled 2014-04-07: qty 1

## 2014-04-07 NOTE — Consult Note (Signed)
MFM Staff Consultation:   I spoke with [name] regarding diabetic pregnancy. I reviewed the classification scheme for Diabetes in Pregnancy, the maternal and fetal/neonatal risks, and the management of pregnancy with IDDM.  First, we discussed the maternal risks of pregnancy with underlying diabetes. With regard to the impact of pregnancy on maternal disease, I told her that patients with underlying vasculopathy, especially of the retina and kidneys, appear to fare much worse during pregnancy than women without underlying vascular disease. I told her that obstetric complications such as pre-eclampsia, sometimes severe and early-onset, are seen with increased frequency in women with insulin-dependent diabetes.  We discussed the second- and third-trimester risk of abnormal fetal growth, including both growth restriction related to placental insufficiency and macrosomia due to maternal-fetal hyperglycemia. We also recognized oligohydramnios, stillbirth, fetal distress, meconium, etc., as potential complications. I also went over the newborn metabolic derangements that can occur with maternal diabetes, including delayed pulmonary maturity and hypoglycemia. We talked about the tightness of glycemic control that is recommended before and during pregnancy. I reviewed the usual goals of achieving a HgbA1c value 6% or below. I detailed the blood sugar targets of having fasting values in the 70-95 mg/dl range, and postprandial values under 120 mg/dl at two hours. I emphasized the time and effort required to achieve tight control in pregnancy, including the meticulous fingerstick monitoring; close adherence to diet, insulin, and an exercise program; and, compliance with obstetric visits and scheduled fetal testing.   I gave the following recommendations to your patient. She should begin to work toward tighter control, aiming for the glycemic targets mentioned above.  She should continue monthly ultrasounds  for fetal growth. She will also need twice weekly non-stress tests beginning next week at 30-[redacted] weeks along with weekly AFI. I recommend HgbA1c surveillance every 4-6 weeks of gestation. Finally, assuming all goes well (ie, good glycemic control, AGA growth and reassuring testing), she should expect to be delivered by or shortly after 39 weeks, if spontaneous labor does not occur beforehand.  Impression: 1. Type II DM 2. Obesity 3. SIUP at [redacted]w[redacted]d Summary of Recommendations: 1. I started the patient on insulin as follows:   NPH 42u qAM and 18 units qhs; Asparte (novolog) 10units with meals. 2. She will continue metformin at 5031mpo daily (current dose of 100043mo bid is intolerable for the patient N/V/D) 3. Our nursing staff taught the patient to inject insulin as prescribed with a kit provided by pharmacy 4. Monthly interval growth beginning at 24 weeks 4. Twice weekly NST and weekly AFI beginning next week given her poor control 5. Close follow up with diabetic education in our unit given her poor control 6. HgbA1c q4-6 weeks 7. q trimester urine culture to screen for asymptomatic bacteruria in your office 8. Delivery at 39 weeks with excellent glyecemic control and reassuring maternal-fetal status; sooner if clinically indicated  9. Given obesity, I recommend anesthesia consultation with admission for delivery. 10. Given obesity, I recommend LMWH prophylaxis postoperatively while in the hospital until fully ambulatory and ready for discharge home if she should end up needing a cesarean delivery for any unforeseen obstetrical indications (noting she is at increased risk for labor dystocia and hence has an increased relative risk for ultimately meeting criteria for needing a cesarean delivery).  Time Spent:  I spent in excess of 40 minutes in consultation with this patient to review records, evaluate her case, and provide her with an adequate discussion and education.  More than 50% of this  time was spent in direct face-to-face counseling. It was a pleasure seeing your patient in the office today.  Thank you for consultation. Please do not hesitate to contact our service for any further questions.   Thank you,  Delman Cheadle Harl Favor, Delman Cheadle, MD, MS, FACOG Assistant Professor Section of [place]

## 2014-04-13 ENCOUNTER — Ambulatory Visit (HOSPITAL_COMMUNITY)
Admission: RE | Admit: 2014-04-13 | Discharge: 2014-04-13 | Disposition: A | Discharge status: Home / Self Care | Payer: BC Managed Care – PPO | Source: Ambulatory Visit | Attending: Obstetrics | Admitting: Obstetrics

## 2014-04-13 ENCOUNTER — Other Ambulatory Visit: Payer: Self-pay | Admitting: Obstetrics

## 2014-04-13 DIAGNOSIS — O24919 Unspecified diabetes mellitus in pregnancy, unspecified trimester: Secondary | ICD-10-CM

## 2014-04-13 DIAGNOSIS — Z794 Long term (current) use of insulin: Secondary | ICD-10-CM | POA: Insufficient documentation

## 2014-04-13 DIAGNOSIS — E119 Type 2 diabetes mellitus without complications: Secondary | ICD-10-CM | POA: Insufficient documentation

## 2014-04-13 DIAGNOSIS — O99810 Abnormal glucose complicating pregnancy: Secondary | ICD-10-CM

## 2014-04-13 DIAGNOSIS — Z3689 Encounter for other specified antenatal screening: Secondary | ICD-10-CM | POA: Insufficient documentation

## 2014-04-13 NOTE — Progress Notes (Signed)
DIABETES: Patient seen for Insulin and glucose reevaluation. Insulin initiated 4/17, 2015. Taking NPH 42u @ 6AM, 18u 9PM, Regular 12u with breakfast, lunch, dinner. In review of glucose reading >50% of readings are outside recommended parameters.   Meal Plan Reviewed to reveal consumption of excessive carbohydrates and not enough protein. Patient verbalized understanding of modifications to be be made. Recommendation of Dr. Otho PerlNitsche, Insulin change NPH 44u @ 6AM, 20u @ 9PM,, Regular 12u with breakfast, lunch, dinner. I will see patient back in 2 weeks for reevaluation.

## 2014-04-17 ENCOUNTER — Encounter: Payer: BC Managed Care – PPO | Admitting: Obstetrics

## 2014-04-19 ENCOUNTER — Encounter: Payer: BC Managed Care – PPO | Admitting: Obstetrics

## 2014-04-20 ENCOUNTER — Encounter (HOSPITAL_COMMUNITY): Payer: Self-pay | Admitting: *Deleted

## 2014-04-20 ENCOUNTER — Other Ambulatory Visit: Payer: Self-pay | Admitting: Obstetrics

## 2014-04-20 ENCOUNTER — Inpatient Hospital Stay (HOSPITAL_COMMUNITY)
Admission: AD | Admit: 2014-04-20 | Discharge: 2014-04-20 | Disposition: A | Discharge status: Home / Self Care | Payer: BC Managed Care – PPO | Source: Ambulatory Visit | Attending: Obstetrics | Admitting: Obstetrics

## 2014-04-20 ENCOUNTER — Ambulatory Visit (HOSPITAL_COMMUNITY)
Admission: RE | Admit: 2014-04-20 | Discharge: 2014-04-20 | Disposition: A | Discharge status: Home / Self Care | Payer: BC Managed Care – PPO | Source: Ambulatory Visit | Attending: Obstetrics | Admitting: Obstetrics

## 2014-04-20 VITALS — BP 126/53 | HR 106

## 2014-04-20 DIAGNOSIS — O24919 Unspecified diabetes mellitus in pregnancy, unspecified trimester: Secondary | ICD-10-CM | POA: Insufficient documentation

## 2014-04-20 DIAGNOSIS — Z794 Long term (current) use of insulin: Secondary | ICD-10-CM | POA: Insufficient documentation

## 2014-04-20 DIAGNOSIS — O99210 Obesity complicating pregnancy, unspecified trimester: Secondary | ICD-10-CM

## 2014-04-20 DIAGNOSIS — E119 Type 2 diabetes mellitus without complications: Secondary | ICD-10-CM | POA: Insufficient documentation

## 2014-04-20 DIAGNOSIS — Z87891 Personal history of nicotine dependence: Secondary | ICD-10-CM | POA: Insufficient documentation

## 2014-04-20 DIAGNOSIS — E669 Obesity, unspecified: Secondary | ICD-10-CM | POA: Insufficient documentation

## 2014-04-20 MED ORDER — INSULIN ASPART 100 UNIT/ML ~~LOC~~ SOLN
12.0000 [IU] | Freq: Once | SUBCUTANEOUS | Status: AC
  Administered 2014-04-20: 12 [IU] via SUBCUTANEOUS

## 2014-04-20 MED ORDER — INSULIN REGULAR HUMAN 100 UNIT/ML IJ SOLN: 12.0000 [IU] | Freq: Once | INTRAMUSCULAR | Status: DC

## 2014-04-20 NOTE — Progress Notes (Signed)
Pt states she had nausea  And vomiting  yesterday

## 2014-04-20 NOTE — MAU Note (Signed)
Patient to MAU for prolong monitoring after BPP 6/8. Sent from MFM. Denies bleeding, leaking or contractions.

## 2014-04-20 NOTE — Discharge Instructions (Signed)
Fetal Movement Counts Patient Name: __________________________________________________ Patient Due Date: ____________________ Performing a fetal movement count is highly recommended in high-risk pregnancies, but it is good for every pregnant woman to do. Your caregiver may ask you to start counting fetal movements at 28 weeks of the pregnancy. Fetal movements often increase:  After eating a full meal.  After physical activity.  After eating or drinking something sweet or cold.  At rest. Pay attention to when you feel the baby is most active. This will help you notice a pattern of your baby's sleep and wake cycles and what factors contribute to an increase in fetal movement. It is important to perform a fetal movement count at the same time each day when your baby is normally most active.  HOW TO COUNT FETAL MOVEMENTS 1. Find a quiet and comfortable area to sit or lie down on your left side. Lying on your left side provides the best blood and oxygen circulation to your baby. 2. Write down the day and time on a sheet of paper or in a journal. 3. Start counting kicks, flutters, swishes, rolls, or jabs in a 2 hour period. You should feel at least 10 movements within 2 hours. 4. If you do not feel 10 movements in 2 hours, wait 2 3 hours and count again. Look for a change in the pattern or not enough counts in 2 hours. SEEK MEDICAL CARE IF:  You feel less than 10 counts in 2 hours, tried twice.  There is no movement in over an hour.  The pattern is changing or taking longer each day to reach 10 counts in 2 hours.  You feel the baby is not moving as he or she usually does. Date: ____________ Movements: ____________ Start time: ____________ Finish time: ____________  Date: ____________ Movements: ____________ Start time: ____________ Finish time: ____________ Date: ____________ Movements: ____________ Start time: ____________ Finish time: ____________ Date: ____________ Movements: ____________  Start time: ____________ Finish time: ____________ Date: ____________ Movements: ____________ Start time: ____________ Finish time: ____________ Date: ____________ Movements: ____________ Start time: ____________ Finish time: ____________ Date: ____________ Movements: ____________ Start time: ____________ Finish time: ____________ Date: ____________ Movements: ____________ Start time: ____________ Finish time: ____________  Date: ____________ Movements: ____________ Start time: ____________ Finish time: ____________ Date: ____________ Movements: ____________ Start time: ____________ Finish time: ____________ Date: ____________ Movements: ____________ Start time: ____________ Finish time: ____________ Date: ____________ Movements: ____________ Start time: ____________ Finish time: ____________ Date: ____________ Movements: ____________ Start time: ____________ Finish time: ____________ Date: ____________ Movements: ____________ Start time: ____________ Finish time: ____________ Date: ____________ Movements: ____________ Start time: ____________ Finish time: ____________  Date: ____________ Movements: ____________ Start time: ____________ Finish time: ____________ Date: ____________ Movements: ____________ Start time: ____________ Finish time: ____________ Date: ____________ Movements: ____________ Start time: ____________ Finish time: ____________ Date: ____________ Movements: ____________ Start time: ____________ Finish time: ____________ Date: ____________ Movements: ____________ Start time: ____________ Finish time: ____________ Date: ____________ Movements: ____________ Start time: ____________ Finish time: ____________ Date: ____________ Movements: ____________ Start time: ____________ Finish time: ____________  Date: ____________ Movements: ____________ Start time: ____________ Finish time: ____________ Date: ____________ Movements: ____________ Start time: ____________ Finish time:  ____________ Date: ____________ Movements: ____________ Start time: ____________ Finish time: ____________ Date: ____________ Movements: ____________ Start time: ____________ Finish time: ____________ Date: ____________ Movements: ____________ Start time: ____________ Finish time: ____________ Date: ____________ Movements: ____________ Start time: ____________ Finish time: ____________ Date: ____________ Movements: ____________ Start time: ____________ Finish time: ____________  Date: ____________ Movements: ____________ Start time: ____________ Finish   time: ____________ Date: ____________ Movements: ____________ Start time: ____________ Finish time: ____________ Date: ____________ Movements: ____________ Start time: ____________ Finish time: ____________ Date: ____________ Movements: ____________ Start time: ____________ Finish time: ____________ Date: ____________ Movements: ____________ Start time: ____________ Finish time: ____________ Date: ____________ Movements: ____________ Start time: ____________ Finish time: ____________ Date: ____________ Movements: ____________ Start time: ____________ Finish time: ____________  Date: ____________ Movements: ____________ Start time: ____________ Finish time: ____________ Date: ____________ Movements: ____________ Start time: ____________ Finish time: ____________ Date: ____________ Movements: ____________ Start time: ____________ Finish time: ____________ Date: ____________ Movements: ____________ Start time: ____________ Finish time: ____________ Date: ____________ Movements: ____________ Start time: ____________ Finish time: ____________ Date: ____________ Movements: ____________ Start time: ____________ Finish time: ____________ Date: ____________ Movements: ____________ Start time: ____________ Finish time: ____________  Date: ____________ Movements: ____________ Start time: ____________ Finish time: ____________ Date: ____________ Movements:  ____________ Start time: ____________ Finish time: ____________ Date: ____________ Movements: ____________ Start time: ____________ Finish time: ____________ Date: ____________ Movements: ____________ Start time: ____________ Finish time: ____________ Date: ____________ Movements: ____________ Start time: ____________ Finish time: ____________ Date: ____________ Movements: ____________ Start time: ____________ Finish time: ____________ Date: ____________ Movements: ____________ Start time: ____________ Finish time: ____________  Date: ____________ Movements: ____________ Start time: ____________ Finish time: ____________ Date: ____________ Movements: ____________ Start time: ____________ Finish time: ____________ Date: ____________ Movements: ____________ Start time: ____________ Finish time: ____________ Date: ____________ Movements: ____________ Start time: ____________ Finish time: ____________ Date: ____________ Movements: ____________ Start time: ____________ Finish time: ____________ Date: ____________ Movements: ____________ Start time: ____________ Finish time: ____________ Document Released: 01/07/2007 Document Revised: 11/24/2012 Document Reviewed: 10/04/2012 [employer] Patient Information 2014 [employer].  

## 2014-04-20 NOTE — MAU Provider Note (Signed)
Chief Complaint:  Non-stress Test      HPI: April Chan is a 33 y.o. G2P1001 at [redacted]w[redacted]d who was sent from [facility] for prolonged monitoring due to [facility] 6/8 (-2 fetal been breathing movement). Findings were discussed with Dr. Clearance Coots with plan to repeat BPP in 4-6 hours if NST is nonreactive. Denies contractions, leakage of fluid or vaginal bleeding. Good fetal movement; "moving nonstop" since 1500.   Pregnancy Course: B DM, obesity  Past Medical History: Past Medical History  Diagnosis Date  . Diabetes mellitus without complication   . Asthma     Past obstetric history: OB History  Gravida Para Term Preterm AB SAB TAB Ectopic Multiple Living  2 1 1       1     # Outcome Date GA Lbr Len/2nd Weight Sex Delivery Anes PTL Lv  2 CUR           1 TRM 11/08/05 [redacted]w[redacted]d  4.082 kg (9 lb) M SVD EPI N Y      Past Surgical History: Past Surgical History  Procedure Laterality Date  . Tonsillectomy    . Wisdom tooth extraction Bilateral      Family History: Family History  Problem Relation Age of Onset  . Polycystic kidney disease Mother   . Hypertension Father   . Diabetes Father   . Heart disease Maternal Grandfather   . Diabetes Paternal Grandmother   . Hypertension Paternal Grandmother   . Cancer Paternal Grandmother   . Diabetes Paternal Grandfather   . Heart disease Paternal Grandfather     Social History: History  Substance Use Topics  . Smoking status: Former Games developer  . Smokeless tobacco: Not on file  . Alcohol Use: No     Comment: occasional    Allergies: No Known Allergies  Meds:  Prescriptions prior to admission  Medication Sig Dispense Refill  . acetaminophen (TYLENOL) 325 MG tablet Take 650 mg by mouth every 6 (six) hours as needed for headache.      . insulin aspart (NOVOLOG) 100 UNIT/ML injection Inject 12-14 Units into the skin 3 (three) times daily before meals. Take 12 units with breakfast and lunch and 14 units with dinner.      . insulin  NPH Human (HUMULIN N,NOVOLIN N) 100 UNIT/ML injection Inject 22-44 Units into the skin 2 (two) times daily. Takes 44 units at 6 am and 22 units at 9 pm.      . metFORMIN (GLUCOPHAGE) 500 MG tablet Take 1 tablet (500 mg total) by mouth 2 (two) times daily with a meal.  60 tablet  11  . omeprazole (PRILOSEC) 20 MG capsule Take 20 mg by mouth at bedtime.       . Prenatal Vit-Fe Fumarate-FA (PRENATAL MULTIVITAMIN) TABS tablet Take 1 tablet by mouth at bedtime.      . valACYclovir (VALTREX) 500 MG tablet Take 500 mg by mouth at bedtime. TAKE 1 TABLET BY MOUTH ONCE A DAY PATIENT NEEDS TO CALL THE OFFICE FOR AN APPOINTMENT      . [DISCONTINUED] valACYclovir (VALTREX) 500 MG tablet TAKE 1 TABLET BY MOUTH ONCE A DAY PATIENT NEEDS TO CALL THE OFFICE FOR AN APPOINTMENT  90 tablet  PRN  . loratadine (CLARITIN) 10 MG tablet Take 1 tablet (10 mg total) by mouth daily.  30 tablet  11    ROS: Pertinent findings in history of present illness.  Physical Exam  Blood pressure 125/57, pulse 93, temperature 97.4 F (36.3 C), temperature source Oral,  resp. rate 18, height 5' 6.5" (1.689 m), weight 137.44 kg (303 lb), last menstrual period 08/15/2013. GENERAL: Obese female in no acute distress.  HEENT: normocephalic HEART: normal rate RESP: normal effort ABDOMEN: Soft, non-tender, gravid large for gestational age EXTREMITIES: Nontender, no edema NEURO: alert and oriented SPECULUM EXAM: NEFG, physiologic discharge, no blood, cervix clean    FHT:  Baseline 125-130, moderate variability, accelerations present, no decelerations Contractions: none   Labs: No results found for this or any previous visit (from the past 24 hour(s)).  Imaging:  [facility] ultrasound  Indication: 33 yr old G2P1001 at 8066w4d with type II diabetes and morbid obesity for fetal ultrasound and BPP secondary to nonreactive NST.  Findings:  1. Single intrauterine pregnancy.  2. Estimated fetal weight is in the 81st%.  3.  Posterior placenta without evidence of previa.  4. Normal amniotic fluid index.  5. The limited anatomy survey is normal. Anatomy survey remains limited.  6. Biophysical profile is 6/8 (-2 for breathing).  Recommendations:  1. Overall appropriate fetal growth although accelerated.  2. Diabetes:  - previously counseled  - antenatal testing  - recommend fetal growth every 4 weeks  - recommend delivery by estimated due date but not prior to 39 weeks in the absence of other complications  - had normal limited fetal echocardiogram  - recommend strict glucose control- reivewed blood sugars today and insulin adjusted to NPH 40 units qam; increase qhs to 22 units; humolog continue 12 units with breakfast and lunch; increase dinner to 14 units  3. Obesity:  - recommend fetal surveillance as above  4. BPP 6/10:  - recommend present to MAU for prolonged monitoring; if tracing is not reactive can repeat BPP in 4-6 hours  Discussed with Dr. Clearance CootsHarper and patient sent to MAU  Eulis FosterKristen Quinn, MD      Progress Notes by Eulis FosterKristen Quinn, MD at 04/20/2014 3:41 PM Version 1 of 2    [facility] ultrasound  Indication: 33 yr old G2P1001 at 5166w4d with type II diabetes and morbid obesity for fetal ultrasound.  Findings:  1. Single intrauterine pregnancy.  2. Estimated fetal weight is in the 81st%.  3. Posterior placenta without evidence of previa.  4. Normal amniotic fluid index.  5. The limited anatomy survey is normal. Anatomy survey remains limited.  Recommendations:  1. Overall appropriate fetal growth although accelerated.  2. Diabetes:  - previously counseled  - antenatal testing  - recommend fetal growth every 4 weeks  - recommend delivery by estimated due date but not prior to 39 weeks in the absence of other complications  - had normal limited fetal echocardiogram  - recommend strict glucose control- reivewed blood sugars today and insulin adjusted to NPH 40 units qam; increase qhs to  22 units; humolog continue 12 units with breakfast and lunch; increase dinner to 14 units  3. Obesity:  - recommend fetal surveillance as above  Eulis FosterKristen Quinn, MD        MAU Course: Novalog 12u SQ with diabetic diet 1930: baseline not well established, will continue EFM 1945: baseline 125-130 Consulted Dr. Gaynell FaceMarshall Assessment: 1. Diabetes mellitus, antepartum(648.03)   G2P1001 at 3366w4d here for fetal testing Category 1 FHR BPP 8/10  Plan: Discharge home Labor precautions and fetal kick counts    Medication List         acetaminophen 325 MG tablet  Commonly known as:  TYLENOL  Take 650 mg by mouth every 6 (six) hours as  needed for headache.     insulin aspart 100 UNIT/ML injection  Commonly known as:  novoLOG  Inject 12-14 Units into the skin 3 (three) times daily before meals. Take 12 units with breakfast and lunch and 14 units with dinner.     insulin NPH Human 100 UNIT/ML injection  Commonly known as:  HUMULIN N,NOVOLIN N  Inject 22-44 Units into the skin 2 (two) times daily. Takes 44 units at 6 am and 22 units at 9 pm.     loratadine 10 MG tablet  Commonly known as:  CLARITIN  Take 1 tablet (10 mg total) by mouth daily.     metFORMIN 500 MG tablet  Commonly known as:  GLUCOPHAGE  Take 1 tablet (500 mg total) by mouth 2 (two) times daily with a meal.     omeprazole 20 MG capsule  Commonly known as:  PRILOSEC  Take 20 mg by mouth at bedtime.     prenatal multivitamin Tabs tablet  Take 1 tablet by mouth at bedtime.     valACYclovir 500 MG tablet  Commonly known as:  VALTREX  Take 500 mg by mouth at bedtime. TAKE 1 TABLET BY MOUTH ONCE A DAY PATIENT NEEDS TO CALL THE OFFICE FOR AN APPOINTMENT       Follow-up Information   Follow up with HARPER,CHARLES A, MD On 04/24/2014. (Keep your scheduled prenatal appointment)    Specialty:  Obstetrics and Gynecology   Contact information:   [address] [place] [address] [phone]        Danae OrleansDeirdre C Andreea Arca, CNM 04/20/2014 6:45 PM

## 2014-04-20 NOTE — Progress Notes (Addendum)
[facility] ultrasound  Indication: 33 yr old G2P1001 at 6926w4d with type II diabetes and morbid obesity for fetal ultrasound and BPP secondary to nonreactive NST.  Findings: 1. Single intrauterine pregnancy. 2. Estimated fetal weight is in the 81st%. 3. Posterior placenta without evidence of previa. 4. Normal amniotic fluid index. 5. The limited anatomy survey is normal. Anatomy survey remains limited. 6. Biophysical profile is 6/8 (-2 for breathing).  Recommendations: 1. Overall appropriate fetal growth although accelerated. 2. Diabetes: - previously counseled - antenatal testing - recommend fetal growth every 4 weeks - recommend delivery by estimated due date but not prior to 39 weeks in the absence of other complications - had normal limited fetal echocardiogram - recommend strict glucose control- reivewed blood sugars today and insulin adjusted to NPH 40 units qam; increase qhs to 22 units; humolog continue 12 units with breakfast and lunch; increase dinner to 14 units 3. Obesity: - recommend fetal surveillance as above 4. BPP 6/10: - recommend present to MAU for prolonged monitoring; if tracing is not reactive can repeat BPP in 4-6 hours  Discussed with Dr. Clearance CootsHarper and patient sent to MAU Eulis FosterKristen Jennier Schissler, MD

## 2014-04-24 ENCOUNTER — Ambulatory Visit (INDEPENDENT_AMBULATORY_CARE_PROVIDER_SITE_OTHER): Payer: BC Managed Care – PPO | Admitting: Obstetrics

## 2014-04-24 ENCOUNTER — Encounter: Payer: Self-pay | Admitting: Obstetrics

## 2014-04-24 VITALS — BP 112/71 | HR 114 | Temp 97.3°F | Wt 302.0 lb

## 2014-04-24 DIAGNOSIS — O24919 Unspecified diabetes mellitus in pregnancy, unspecified trimester: Secondary | ICD-10-CM

## 2014-04-24 DIAGNOSIS — O0990 Supervision of high risk pregnancy, unspecified, unspecified trimester: Secondary | ICD-10-CM | POA: Insufficient documentation

## 2014-04-24 DIAGNOSIS — Z3480 Encounter for supervision of other normal pregnancy, unspecified trimester: Secondary | ICD-10-CM

## 2014-04-24 LAB — POCT URINALYSIS DIPSTICK
Blood, UA: NEGATIVE
Glucose, UA: NEGATIVE
Ketones, UA: NEGATIVE
Nitrite, UA: NEGATIVE
Spec Grav, UA: 1.025
pH, UA: 5

## 2014-04-24 NOTE — Progress Notes (Signed)
Subjective:    April PeersMary L Chan is a 33 y.o. female being seen today for her obstetrical visit. She is at 5261w1d gestation. Patient reports no complaints. Fetal movement: normal.  Problem List Items Addressed This Visit   None    Visit Diagnoses   Supervision of other normal pregnancy    -  Primary    Relevant Orders       POCT urinalysis dipstick      Patient Active Problem List   Diagnosis Date Noted  . Diabetes mellitus, antepartum(648.03) 04/05/2014  . Allergic rhinitis, seasonal 04/05/2014  . Gestational diabetes mellitus in pregnancy 12/29/2013  . Candidiasis of vulva and vagina 12/29/2013  . Threatened abortion, unspecified as to episode of care 11/04/2013   Objective:    BP 112/71  Pulse 114  Temp(Src) 97.3 F (36.3 C)  Wt 302 lb (136.986 kg)  LMP 08/15/2013 FHT:  150 BPM  Uterine Size: Not able to determine due to morbid obesity.  EFW at 81st percentile at 31 weeks on ultrasound.  Presentation: unsure     Assessment:    Pregnancy @ 5561w1d weeks .  Diabetic, on insulin.  Fetus with accelerated growth.  Will repeat U/S at 36 weeks and consider delivery at 39 weeks.  Plan:     labs reviewed, problem list updated Consent signed. GBS sent TDAP offered  Rhogam given for RH negative Pediatrician: discussed. Infant feeding: plans to breastfeed. Maternity leave: not discussed.. Cigarette smoking: Quit. Orders Placed This Encounter  Procedures  . POCT urinalysis dipstick   No orders of the defined types were placed in this encounter.   Follow up in 2 Weeks.

## 2014-04-27 ENCOUNTER — Encounter: Payer: BC Managed Care – PPO | Attending: Obstetrics | Admitting: *Deleted

## 2014-04-27 ENCOUNTER — Other Ambulatory Visit: Payer: Self-pay | Admitting: Obstetrics

## 2014-04-27 ENCOUNTER — Ambulatory Visit (HOSPITAL_COMMUNITY)
Admission: RE | Admit: 2014-04-27 | Discharge: 2014-04-27 | Disposition: A | Discharge status: Home / Self Care | Payer: BC Managed Care – PPO | Source: Ambulatory Visit | Attending: Obstetrics | Admitting: Obstetrics

## 2014-04-27 ENCOUNTER — Other Ambulatory Visit (HOSPITAL_COMMUNITY): Payer: BC Managed Care – PPO

## 2014-04-27 DIAGNOSIS — O99810 Abnormal glucose complicating pregnancy: Secondary | ICD-10-CM | POA: Insufficient documentation

## 2014-04-27 DIAGNOSIS — Z713 Dietary counseling and surveillance: Secondary | ICD-10-CM | POA: Insufficient documentation

## 2014-04-27 DIAGNOSIS — O24919 Unspecified diabetes mellitus in pregnancy, unspecified trimester: Secondary | ICD-10-CM

## 2014-04-27 NOTE — Progress Notes (Signed)
Glucose readings reviewed. FBS 93-106 3:4 readings outside of range, 2hpp 51-140 1:11 out of range. Consult with Dr. Sherrie Georgeecker, will increase NPH @ 2200 to 26units.

## 2014-05-04 ENCOUNTER — Ambulatory Visit (HOSPITAL_COMMUNITY)
Admission: RE | Admit: 2014-05-04 | Discharge: 2014-05-04 | Disposition: A | Discharge status: Home / Self Care | Payer: BC Managed Care – PPO | Source: Ambulatory Visit | Attending: Obstetrics | Admitting: Obstetrics

## 2014-05-04 ENCOUNTER — Other Ambulatory Visit: Payer: Self-pay | Admitting: Obstetrics

## 2014-05-04 DIAGNOSIS — O24919 Unspecified diabetes mellitus in pregnancy, unspecified trimester: Secondary | ICD-10-CM | POA: Insufficient documentation

## 2014-05-04 DIAGNOSIS — E119 Type 2 diabetes mellitus without complications: Secondary | ICD-10-CM | POA: Insufficient documentation

## 2014-05-08 ENCOUNTER — Encounter: Payer: BC Managed Care – PPO | Admitting: Obstetrics

## 2014-05-11 ENCOUNTER — Ambulatory Visit (HOSPITAL_COMMUNITY)
Admission: RE | Admit: 2014-05-11 | Discharge: 2014-05-11 | Disposition: A | Discharge status: Home / Self Care | Payer: BC Managed Care – PPO | Source: Ambulatory Visit | Attending: Obstetrics | Admitting: Obstetrics

## 2014-05-11 ENCOUNTER — Observation Stay (HOSPITAL_COMMUNITY)
Admission: AD | Admit: 2014-05-11 | Discharge: 2014-05-12 | Disposition: A | Discharge status: Home / Self Care | Payer: BC Managed Care – PPO | Source: Ambulatory Visit | Attending: Obstetrics | Admitting: Obstetrics

## 2014-05-11 ENCOUNTER — Encounter (HOSPITAL_COMMUNITY): Payer: Self-pay | Admitting: Obstetrics and Gynecology

## 2014-05-11 ENCOUNTER — Other Ambulatory Visit: Payer: Self-pay | Admitting: Obstetrics

## 2014-05-11 DIAGNOSIS — Z833 Family history of diabetes mellitus: Secondary | ICD-10-CM | POA: Insufficient documentation

## 2014-05-11 DIAGNOSIS — Z87891 Personal history of nicotine dependence: Secondary | ICD-10-CM | POA: Insufficient documentation

## 2014-05-11 DIAGNOSIS — Z794 Long term (current) use of insulin: Secondary | ICD-10-CM | POA: Insufficient documentation

## 2014-05-11 DIAGNOSIS — O24919 Unspecified diabetes mellitus in pregnancy, unspecified trimester: Secondary | ICD-10-CM

## 2014-05-11 DIAGNOSIS — Z8249 Family history of ischemic heart disease and other diseases of the circulatory system: Secondary | ICD-10-CM | POA: Insufficient documentation

## 2014-05-11 DIAGNOSIS — O288 Other abnormal findings on antenatal screening of mother: Secondary | ICD-10-CM | POA: Diagnosis present

## 2014-05-11 DIAGNOSIS — O0990 Supervision of high risk pregnancy, unspecified, unspecified trimester: Secondary | ICD-10-CM

## 2014-05-11 DIAGNOSIS — E119 Type 2 diabetes mellitus without complications: Secondary | ICD-10-CM | POA: Insufficient documentation

## 2014-05-11 LAB — CBC
HEMATOCRIT: 35.4 % — AB (ref 36.0–46.0)
Hemoglobin: 11.8 g/dL — ABNORMAL LOW (ref 12.0–15.0)
MCH: 27.9 pg (ref 26.0–34.0)
MCHC: 33.3 g/dL (ref 30.0–36.0)
MCV: 83.7 fL (ref 78.0–100.0)
Platelets: 198 10*3/uL (ref 150–400)
RBC: 4.23 MIL/uL (ref 3.87–5.11)
RDW: 14.5 % (ref 11.5–15.5)
WBC: 15.6 10*3/uL — AB (ref 4.0–10.5)

## 2014-05-11 LAB — TYPE AND SCREEN
ABO/RH(D): O POS
Antibody Screen: NEGATIVE

## 2014-05-11 LAB — ABO/RH: ABO/RH(D): O POS

## 2014-05-11 LAB — GLUCOSE, CAPILLARY
Glucose-Capillary: 67 mg/dL — ABNORMAL LOW (ref 70–99)
Glucose-Capillary: 86 mg/dL (ref 70–99)

## 2014-05-11 MED ORDER — DOCUSATE SODIUM 100 MG PO CAPS: 100.0000 mg | ORAL_CAPSULE | Freq: Every day | ORAL | Status: DC

## 2014-05-11 MED ORDER — ACETAMINOPHEN 325 MG PO TABS
650.0000 mg | ORAL_TABLET | ORAL | Status: DC | PRN
  Administered 2014-05-11: 650 mg via ORAL
  Filled 2014-05-11: qty 2

## 2014-05-11 MED ORDER — INSULIN ASPART 100 UNIT/ML ~~LOC~~ SOLN
14.0000 [IU] | Freq: Every day | SUBCUTANEOUS | Status: DC
  Administered 2014-05-11: 14 [IU] via SUBCUTANEOUS

## 2014-05-11 MED ORDER — INSULIN NPH (HUMAN) (ISOPHANE) 100 UNIT/ML ~~LOC~~ SUSP
26.0000 [IU] | Freq: Every day | SUBCUTANEOUS | Status: DC
  Administered 2014-05-11: 26 [IU] via SUBCUTANEOUS

## 2014-05-11 MED ORDER — FAMOTIDINE 20 MG PO TABS
20.0000 mg | ORAL_TABLET | Freq: Every day | ORAL | Status: DC
Start: 2014-05-11 — End: 2014-05-12
  Administered 2014-05-11: 20 mg via ORAL
  Filled 2014-05-11: qty 1

## 2014-05-11 MED ORDER — INSULIN NPH (HUMAN) (ISOPHANE) 100 UNIT/ML ~~LOC~~ SUSP
44.0000 [IU] | Freq: Every day | SUBCUTANEOUS | Status: DC
  Administered 2014-05-12: 44 [IU] via SUBCUTANEOUS
  Filled 2014-05-11: qty 10

## 2014-05-11 MED ORDER — INSULIN ASPART 100 UNIT/ML ~~LOC~~ SOLN
12.0000 [IU] | Freq: Two times a day (BID) | SUBCUTANEOUS | Status: DC
  Administered 2014-05-12 (×2): 12 [IU] via SUBCUTANEOUS

## 2014-05-11 MED ORDER — PRENATAL MULTIVITAMIN CH
1.0000 | ORAL_TABLET | Freq: Every day | ORAL | Status: DC
  Administered 2014-05-11: 1 via ORAL
  Filled 2014-05-11: qty 1

## 2014-05-11 MED ORDER — VALACYCLOVIR HCL 500 MG PO TABS
500.0000 mg | ORAL_TABLET | Freq: Every day | ORAL | Status: DC
  Administered 2014-05-11: 500 mg via ORAL
  Filled 2014-05-11 (×2): qty 1

## 2014-05-11 MED ORDER — ZOLPIDEM TARTRATE 5 MG PO TABS
5.0000 mg | ORAL_TABLET | Freq: Every evening | ORAL | Status: DC | PRN
  Administered 2014-05-11: 5 mg via ORAL
  Filled 2014-05-11: qty 1

## 2014-05-11 MED ORDER — PRENATAL MULTIVITAMIN CH: 1.0000 | ORAL_TABLET | Freq: Every day | ORAL | Status: DC

## 2014-05-11 MED ORDER — METFORMIN HCL 500 MG PO TABS
500.0000 mg | ORAL_TABLET | Freq: Two times a day (BID) | ORAL | Status: DC
  Administered 2014-05-11 – 2014-05-12 (×2): 500 mg via ORAL
  Filled 2014-05-11 (×4): qty 1

## 2014-05-11 MED ORDER — CALCIUM CARBONATE ANTACID 500 MG PO CHEW: 2.0000 | CHEWABLE_TABLET | ORAL | Status: DC | PRN

## 2014-05-12 ENCOUNTER — Observation Stay (HOSPITAL_COMMUNITY): Payer: BC Managed Care – PPO

## 2014-05-12 LAB — GLUCOSE, CAPILLARY: Glucose-Capillary: 118 mg/dL — ABNORMAL HIGH (ref 70–99)

## 2014-05-12 NOTE — H&P (Signed)
April Chan is a 33 y.o. female presenting for non reactive NST during MFM consultation for Diabetes in pregnancy..  Extended monitoring recommended by MFM. Marland Kitchen Maternal Medical History:  Fetal activity: Perceived fetal activity is normal.   Last perceived fetal movement was within the past hour.    Prenatal complications: Diabetes.  Prenatal Complications - Diabetes: type 2. Diabetes is managed by insulin injections.      OB History   Grav Para Term Preterm Abortions TAB SAB Ect Mult Living   2 1 1       1      Past Medical History  Diagnosis Date  . Diabetes mellitus without complication   . Asthma    Past Surgical History  Procedure Laterality Date  . Tonsillectomy    . Wisdom tooth extraction Bilateral    Family History: family history includes Cancer in her paternal grandmother; Diabetes in her father, paternal grandfather, and paternal grandmother; Heart disease in her maternal grandfather and paternal grandfather; Hypertension in her father and paternal grandmother; Polycystic kidney disease in her mother. Social History:  reports that she quit smoking about a year ago. Her smoking use included Cigarettes. She smoked 0.25 packs per day. She does not have any smokeless tobacco history on file. She reports that she does not drink alcohol or use illicit drugs.   Prenatal Transfer Tool  Maternal Diabetes: Yes:  Diabetes Type:  Insulin/Medication controlled Genetic Screening: Normal Maternal Ultrasounds/Referrals: Normal Fetal Ultrasounds or other Referrals:  Referred to [facility]  Maternal Substance Abuse:  No Significant Maternal Medications:  Meds include: Other:  Insulin Significant Maternal Lab Results:  None Other Comments:  None  Review of Systems  All other systems reviewed and are negative.     Blood pressure 110/56, pulse 89, temperature 97.9 F (36.6 C), temperature source Oral, resp. rate 18, height 5' 6.5" (1.689 m), weight 306 lb (138.801  kg), last menstrual period 08/15/2013. Maternal Exam:  Abdomen: Patient reports no abdominal tenderness.   Physical Exam  Constitutional: She is oriented to person, place, and time. She appears well-developed and well-nourished.  HENT:  Head: Normocephalic and atraumatic.  Eyes: Conjunctivae are normal. Pupils are equal, round, and reactive to light.  Neck: Normal range of motion. Neck supple.  Cardiovascular: Normal rate and regular rhythm.   Respiratory: Effort normal.  GI: Soft.  Neurological: She is alert and oriented to person, place, and time.  Skin: Skin is warm and dry.  Psychiatric: She has a normal mood and affect. Her behavior is normal. Judgment and thought content normal.    Prenatal labs: ABO, Rh: --/--/O POS, O POS (05/21 1942) Antibody: NEG (05/21 1942) Rubella: 3.29 (12/11 1755) RPR: NON REAC (12/11 1755)  HBsAg: NEGATIVE (12/11 1755)  HIV: NON REACTIVE (12/11 1755)  GBS:     Assessment/Plan: 34.5 weeks.  Diabetes.  Non reactive NST.  Admitted for extended fetal monitoring.   Brock Bad 05/12/2014, 1:19 AM

## 2014-05-12 NOTE — Discharge Summary (Signed)
Physician Discharge Summary  Patient ID: April Chan MRN: [id number] DOB/AGE: 32-May-1982 32 y.o.  Admit date: 05/11/2014 Discharge date: 05/12/2014  Admission Diagnoses: Non-reactive NST  Discharge Diagnoses:  Active Problems:   Non-reactive NST (non-stress test)   Discharged Condition: stable, Reactive NST and BPP 8/8 on 05/12/2014  Hospital Course: stable  Consults: MFM saw patient prior to admission  Significant Diagnostic Studies: Reactive NST  Treatments: none  Discharge Exam: Blood pressure 104/56, pulse 92, temperature 97.9 F (36.6 C), temperature source Oral, resp. rate 18, height 5' 6.5" (1.689 m), weight 306 lb (138.801 kg), last menstrual period 08/15/2013. General appearance: alert and cooperative Head: Normocephalic, without obvious abnormality, atraumatic Resp: clear to auscultation bilaterally GI: soft, non-tender; bowel sounds normal; no masses,  no organomegaly Neurologic: Grossly normal  FHR 125, +accel, neg decel, mod variability  Patient's elevated fasting glucose today most likely due to apple juice taken the evening before.   Patient to continue on previously prescribed insulin regimen.  Disposition: 01-Home or Self Care  Discharge Instructions   Discharge activity:  No Restrictions    Complete by:  As directed      Discharge diet:    Complete by:  As directed   Continue w/ diabetic diet and insulin medications as previously prescribed     Discharge instructions    Complete by:  As directed   Follow up in clinic early next week     Fetal Kick Count:  Lie on our left side for one hour after a meal, and count the number of times your baby kicks.  If it is less than 5 times, get up, move around and drink some juice.  Repeat the test 30 minutes later.  If it is still less than 5 kicks in an hour, notify your doctor.    Complete by:  As directed      Notify physician for a general feeling that "something is not right"    Complete by:  As directed       Notify physician for increase or change in vaginal discharge    Complete by:  As directed      Notify physician for intestinal cramps, with or without diarrhea, sometimes described as "gas pain"    Complete by:  As directed      Notify physician for leaking of fluid    Complete by:  As directed      Notify physician for low, dull backache, unrelieved by heat or Tylenol    Complete by:  As directed      Notify physician for menstrual like cramps    Complete by:  As directed      Notify physician for pelvic pressure    Complete by:  As directed      Notify physician for uterine contractions.  These may be painless and feel like the uterus is tightening or the baby is  "balling up"    Complete by:  As directed      Notify physician for vaginal bleeding    Complete by:  As directed      PRETERM LABOR:  Includes any of the follwing symptoms that occur between 20 - [redacted] weeks gestation.  If these symptoms are not stopped, preterm labor can result in preterm delivery, placing your baby at risk    Complete by:  As directed             Medication List         acetaminophen 325 MG  tablet  Commonly known as:  TYLENOL  Take 650 mg by mouth every 6 (six) hours as needed for headache.     insulin aspart 100 UNIT/ML injection  Commonly known as:  novoLOG  Inject 12-14 Units into the skin 3 (three) times daily before meals. Take 12 units with breakfast and lunch and 14 units with dinner.     insulin NPH Human 100 UNIT/ML injection  Commonly known as:  HUMULIN N,NOVOLIN N  Inject 22-44 Units into the skin 2 (two) times daily. Takes 44 units at 6 am and 26 units at 9 pm.     metFORMIN 500 MG tablet  Commonly known as:  GLUCOPHAGE  Take 1 tablet (500 mg total) by mouth 2 (two) times daily with a meal.     omeprazole 20 MG capsule  Commonly known as:  PRILOSEC  Take 20 mg by mouth at bedtime.     prenatal multivitamin Tabs tablet  Take 1 tablet by mouth at bedtime.     valACYclovir  500 MG tablet  Commonly known as:  VALTREX  Take 500 mg by mouth at bedtime. TAKE 1 TABLET BY MOUTH ONCE A DAY PATIENT NEEDS TO CALL THE OFFICE FOR AN APPOINTMENT         Signed: Rickard Patience 05/12/2014, 1:00 PM

## 2014-05-12 NOTE — Progress Notes (Signed)
UR completed 

## 2014-05-12 NOTE — Progress Notes (Signed)
Patient to US for BPP

## 2014-05-18 ENCOUNTER — Encounter (HOSPITAL_COMMUNITY): Payer: Self-pay

## 2014-05-18 ENCOUNTER — Ambulatory Visit (HOSPITAL_COMMUNITY)
Admission: RE | Admit: 2014-05-18 | Discharge: 2014-05-18 | Disposition: A | Discharge status: Home / Self Care | Payer: BC Managed Care – PPO | Source: Ambulatory Visit | Attending: Obstetrics | Admitting: Obstetrics

## 2014-05-18 DIAGNOSIS — O24919 Unspecified diabetes mellitus in pregnancy, unspecified trimester: Secondary | ICD-10-CM | POA: Insufficient documentation

## 2014-05-18 DIAGNOSIS — E119 Type 2 diabetes mellitus without complications: Secondary | ICD-10-CM | POA: Insufficient documentation

## 2014-05-18 DIAGNOSIS — Z3689 Encounter for other specified antenatal screening: Secondary | ICD-10-CM | POA: Insufficient documentation

## 2014-05-23 ENCOUNTER — Ambulatory Visit (INDEPENDENT_AMBULATORY_CARE_PROVIDER_SITE_OTHER): Payer: BC Managed Care – PPO | Admitting: Obstetrics

## 2014-05-23 ENCOUNTER — Other Ambulatory Visit: Payer: Self-pay | Admitting: Obstetrics

## 2014-05-23 ENCOUNTER — Encounter: Payer: Self-pay | Admitting: *Deleted

## 2014-05-23 VITALS — BP 109/75 | HR 105 | Temp 98.1°F | Wt 309.0 lb

## 2014-05-23 DIAGNOSIS — E669 Obesity, unspecified: Secondary | ICD-10-CM

## 2014-05-23 DIAGNOSIS — O368190 Decreased fetal movements, unspecified trimester, not applicable or unspecified: Secondary | ICD-10-CM

## 2014-05-23 DIAGNOSIS — O99210 Obesity complicating pregnancy, unspecified trimester: Secondary | ICD-10-CM

## 2014-05-23 DIAGNOSIS — O99810 Abnormal glucose complicating pregnancy: Secondary | ICD-10-CM

## 2014-05-23 DIAGNOSIS — Z3480 Encounter for supervision of other normal pregnancy, unspecified trimester: Secondary | ICD-10-CM

## 2014-05-23 LAB — POCT URINALYSIS DIPSTICK
BILIRUBIN UA: NEGATIVE
Blood, UA: NEGATIVE
GLUCOSE UA: NEGATIVE
Ketones, UA: NEGATIVE
LEUKOCYTES UA: NEGATIVE
Nitrite, UA: NEGATIVE
Protein, UA: NEGATIVE
Spec Grav, UA: 1.025
UROBILINOGEN UA: NEGATIVE
pH, UA: 5

## 2014-05-24 ENCOUNTER — Encounter: Payer: Self-pay | Admitting: Obstetrics

## 2014-05-24 NOTE — Progress Notes (Signed)
Subjective:    April Chan is a 33 y.o. female being seen today for her obstetrical visit. She is at [redacted]w[redacted]d gestation. Patient reports no complaints. Fetal movement: normal.  Problem List Items Addressed This Visit   None    Visit Diagnoses   Supervision of other normal pregnancy    -  Primary    Relevant Orders       POCT urinalysis dipstick (Completed)      Patient Active Problem List   Diagnosis Date Noted  . Non-reactive NST (non-stress test) 05/11/2014  . Unspecified high-risk pregnancy 04/24/2014  . Diabetes mellitus, antepartum(648.03) 04/05/2014  . Allergic rhinitis, seasonal 04/05/2014  . Gestational diabetes mellitus in pregnancy 12/29/2013  . Candidiasis of vulva and vagina 12/29/2013  . Threatened abortion, unspecified as to episode of care 11/04/2013   Objective:    BP 109/75  Pulse 105  Temp(Src) 98.1 F (36.7 C)  Wt 309 lb (140.161 kg)  LMP 08/15/2013 FHT:  150 BPM  Uterine Size: size equals dates  Presentation: unsure     Assessment:    Pregnancy @ [redacted]w[redacted]d weeks   Plan:     labs reviewed, problem list updated Consent signed. GBS sent TDAP offered  Rhogam given for RH negative Pediatrician: discussed. Infant feeding: plans to breastfeed. Maternity leave: discussed. Cigarette smoking: quit date May 2014. Orders Placed This Encounter  Procedures  . POCT urinalysis dipstick   No orders of the defined types were placed in this encounter.   Follow up in 1 Week.

## 2014-05-25 ENCOUNTER — Ambulatory Visit (HOSPITAL_COMMUNITY)
Admission: RE | Admit: 2014-05-25 | Discharge: 2014-05-25 | Disposition: A | Discharge status: Home / Self Care | Payer: BC Managed Care – PPO | Source: Ambulatory Visit | Attending: Obstetrics | Admitting: Obstetrics

## 2014-05-25 ENCOUNTER — Ambulatory Visit (HOSPITAL_COMMUNITY)
Admission: RE | Admit: 2014-05-25 | Discharge: 2014-05-25 | Disposition: A | Discharge status: Home / Self Care | Payer: BC Managed Care – PPO | Source: Ambulatory Visit | Attending: Family Medicine | Admitting: Family Medicine

## 2014-05-25 ENCOUNTER — Encounter (HOSPITAL_COMMUNITY): Payer: Self-pay

## 2014-05-25 DIAGNOSIS — O99210 Obesity complicating pregnancy, unspecified trimester: Secondary | ICD-10-CM

## 2014-05-25 DIAGNOSIS — O99810 Abnormal glucose complicating pregnancy: Secondary | ICD-10-CM

## 2014-05-25 DIAGNOSIS — E669 Obesity, unspecified: Secondary | ICD-10-CM

## 2014-05-30 ENCOUNTER — Encounter: Payer: BC Managed Care – PPO | Admitting: Obstetrics

## 2014-05-31 ENCOUNTER — Ambulatory Visit (INDEPENDENT_AMBULATORY_CARE_PROVIDER_SITE_OTHER): Payer: BC Managed Care – PPO | Admitting: Obstetrics

## 2014-05-31 ENCOUNTER — Encounter: Payer: Self-pay | Admitting: Obstetrics

## 2014-05-31 ENCOUNTER — Encounter: Payer: Self-pay | Admitting: *Deleted

## 2014-05-31 VITALS — BP 107/76 | HR 109 | Temp 97.9°F | Wt 302.0 lb

## 2014-05-31 DIAGNOSIS — O24913 Unspecified diabetes mellitus in pregnancy, third trimester: Secondary | ICD-10-CM | POA: Insufficient documentation

## 2014-05-31 DIAGNOSIS — O24919 Unspecified diabetes mellitus in pregnancy, unspecified trimester: Secondary | ICD-10-CM

## 2014-05-31 DIAGNOSIS — O0990 Supervision of high risk pregnancy, unspecified, unspecified trimester: Secondary | ICD-10-CM

## 2014-05-31 NOTE — Progress Notes (Signed)
Subjective:    April Chan is a 33 y.o. female being seen today for her obstetrical visit. She is at [redacted]w[redacted]d gestation. Patient reports no complaints. Fetal movement: normal.  Problem List Items Addressed This Visit   None     Patient Active Problem List   Diagnosis Date Noted  . Non-reactive NST (non-stress test) 05/11/2014  . Unspecified high-risk pregnancy 04/24/2014  . Diabetes mellitus, antepartum(648.03) 04/05/2014  . Allergic rhinitis, seasonal 04/05/2014  . Gestational diabetes mellitus in pregnancy 12/29/2013  . Candidiasis of vulva and vagina 12/29/2013  . Threatened abortion, unspecified as to episode of care 11/04/2013    Objective:    BP 107/76  Pulse 109  Temp(Src) 97.9 F (36.6 C)  Wt 302 lb (136.986 kg)  LMP 08/15/2013 FHT: 150 BPM  Uterine Size: undeterninable due to obesity.  Presentations: unsure  Pelvic Exam: Deferred    Assessment:    Pregnancy @ [redacted]w[redacted]d weeks   Plan:   Plans for delivery: Vaginal anticipated; labs reviewed; problem list updated Counseling: Consent signed. Infant feeding: plans to breastfeed. Cigarette smoking: quit.05-11-13 L&D discussion: symptoms of labor, discussed when to call, discussed what number to call, anesthetic/analgesic options reviewed and delivering clinician:  plans Physician. Postpartum supports and preparation: circumcision discussed and contraception plans discussed.  Follow up in 1 Week.

## 2014-06-01 ENCOUNTER — Ambulatory Visit (HOSPITAL_COMMUNITY)
Admission: RE | Admit: 2014-06-01 | Discharge: 2014-06-01 | Disposition: A | Discharge status: Home / Self Care | Payer: BC Managed Care – PPO | Source: Ambulatory Visit | Attending: Obstetrics | Admitting: Obstetrics

## 2014-06-01 ENCOUNTER — Encounter (HOSPITAL_COMMUNITY): Payer: Self-pay

## 2014-06-01 ENCOUNTER — Other Ambulatory Visit: Payer: Self-pay | Admitting: Obstetrics

## 2014-06-01 ENCOUNTER — Telehealth: Payer: Self-pay | Admitting: *Deleted

## 2014-06-01 DIAGNOSIS — O99810 Abnormal glucose complicating pregnancy: Secondary | ICD-10-CM

## 2014-06-01 DIAGNOSIS — O99210 Obesity complicating pregnancy, unspecified trimester: Secondary | ICD-10-CM

## 2014-06-01 DIAGNOSIS — Z6841 Body Mass Index (BMI) 40.0 and over, adult: Secondary | ICD-10-CM | POA: Insufficient documentation

## 2014-06-01 DIAGNOSIS — E669 Obesity, unspecified: Secondary | ICD-10-CM | POA: Insufficient documentation

## 2014-06-01 DIAGNOSIS — E119 Type 2 diabetes mellitus without complications: Secondary | ICD-10-CM | POA: Insufficient documentation

## 2014-06-01 DIAGNOSIS — Z3689 Encounter for other specified antenatal screening: Secondary | ICD-10-CM | POA: Insufficient documentation

## 2014-06-01 DIAGNOSIS — O24919 Unspecified diabetes mellitus in pregnancy, unspecified trimester: Secondary | ICD-10-CM | POA: Insufficient documentation

## 2014-06-01 NOTE — Progress Notes (Signed)
[facility]  Indication: 33 yr old G2P1001 at [redacted]w[redacted]d with type II diabetes and morbid obesity for BPP secondary to nonreactive NST.  Findings: 1. Single intrauterine pregnancy. 2. Posterior placenta without evidence of previa. 3. Normal amniotic fluid index. 4. Biophysical profile is normal at 8/8.  Recommendations: 1. Diabetes: - previously counseled - antenatal testing - recommend fetal growth every 4 weeks; due in 1 week - recommend delivery by estimated due date but not prior to 39 weeks in the absence of other complications - had normal limited fetal echocardiogram - recommend strict glucose control- patient did not bring in sugars today; diabetic educator has counseled patient on importance of checking and bringing in sugar log 3. Obesity: - recommend fetal surveillance as above - ultrasound for anatomy remains limited 4. BPP 8/10:  Eulis Foster, MD

## 2014-06-01 NOTE — Telephone Encounter (Signed)
Patient states she saw her eye doctor and was diagnosed with a haze around her optic nerve/ around the cord borders. He stated could be  pregnancy related vs possible cranial pressure. Dr Delora Fuel [phone] Patient wants to know what your recommendation for follow up is.

## 2014-06-02 ENCOUNTER — Telehealth: Payer: Self-pay | Admitting: *Deleted

## 2014-06-02 NOTE — Telephone Encounter (Signed)
Needs appt. For consultation.

## 2014-06-02 NOTE — Telephone Encounter (Signed)
Pt called stating that she needing her GBS and pap at next visit. Return call to pt on 06/02/14 making her aware that it has been noted in chart to be collected at next visit on 06/06/14.

## 2014-06-06 ENCOUNTER — Encounter: Payer: BC Managed Care – PPO | Admitting: Obstetrics

## 2014-06-06 ENCOUNTER — Ambulatory Visit (INDEPENDENT_AMBULATORY_CARE_PROVIDER_SITE_OTHER): Payer: BC Managed Care – PPO | Admitting: Advanced Practice Midwife

## 2014-06-06 VITALS — BP 107/74 | HR 113 | Temp 97.8°F | Wt 309.0 lb

## 2014-06-06 DIAGNOSIS — Z3480 Encounter for supervision of other normal pregnancy, unspecified trimester: Secondary | ICD-10-CM

## 2014-06-06 DIAGNOSIS — O0990 Supervision of high risk pregnancy, unspecified, unspecified trimester: Secondary | ICD-10-CM

## 2014-06-06 DIAGNOSIS — O24919 Unspecified diabetes mellitus in pregnancy, unspecified trimester: Secondary | ICD-10-CM

## 2014-06-06 NOTE — Progress Notes (Signed)
Subjective: April PeersMary L Chan is a 33 y.o. at 7838 weeks Patient denies vaginal leaking of fluid or bleeding, denies contractions.  Reports positive fetal movment.  Patient reports her BS are hanging out in the 140s, for PP, she has had a difficult time maintaining 120s recently. Patient reports she has been really hot.  Objective: Filed Vitals:   06/06/14 1445  BP: 107/74  Pulse: 113  Temp: 97.8 F (36.6 C)   150 FHR  Fetal Position cephalic NST FHR baseline 155 w/ + accel, mod variability, Toco; rare, infrequent FHR was initially tachycardic and resolved.  Assessment: Patient Active Problem List   Diagnosis Date Noted  . Diabetes mellitus complicating pregnancy in third trimester, antepartum 05/31/2014  . Non-reactive NST (non-stress test) 05/11/2014  . Unspecified high-risk pregnancy 04/24/2014  . Diabetes mellitus, antepartum(648.03) 04/05/2014  . Allergic rhinitis, seasonal 04/05/2014  . Gestational diabetes mellitus in pregnancy 12/29/2013  . Candidiasis of vulva and vagina 12/29/2013  . Threatened abortion, unspecified as to episode of care 11/04/2013  Reactive and Reassuring NST  Plan: Patient to return for BPP later in the week Reviewed importance of blood sugar control in pregnancy, patient aware. IOL scheduled for next week at this time. Will review BS and patient status w/ MD Clearance CootsHarper. Reviewed warning signs in pregnancy. Patient to call with concerns PRN. Reviewed triage location.   Amy Wilson SingerWren CNM

## 2014-06-07 LAB — POCT URINALYSIS DIPSTICK
Blood, UA: NEGATIVE
Glucose, UA: 250
Ketones, UA: NEGATIVE
Leukocytes, UA: NEGATIVE
NITRITE UA: NEGATIVE
SPEC GRAV UA: 1.025
pH, UA: 5

## 2014-06-08 ENCOUNTER — Ambulatory Visit (HOSPITAL_COMMUNITY)
Admission: RE | Admit: 2014-06-08 | Discharge: 2014-06-08 | Disposition: A | Discharge status: Home / Self Care | Payer: BC Managed Care – PPO | Source: Ambulatory Visit | Attending: Obstetrics | Admitting: Obstetrics

## 2014-06-08 ENCOUNTER — Ambulatory Visit (HOSPITAL_COMMUNITY)
Admission: RE | Admit: 2014-06-08 | Discharge: 2014-06-08 | Disposition: A | Discharge status: Home / Self Care | Payer: BC Managed Care – PPO | Source: Ambulatory Visit | Attending: Family Medicine | Admitting: Family Medicine

## 2014-06-08 ENCOUNTER — Telehealth (HOSPITAL_COMMUNITY): Payer: Self-pay | Admitting: *Deleted

## 2014-06-08 ENCOUNTER — Encounter (HOSPITAL_COMMUNITY): Payer: Self-pay

## 2014-06-08 ENCOUNTER — Encounter (HOSPITAL_COMMUNITY): Payer: Self-pay | Admitting: *Deleted

## 2014-06-08 DIAGNOSIS — O99210 Obesity complicating pregnancy, unspecified trimester: Secondary | ICD-10-CM

## 2014-06-08 DIAGNOSIS — O99810 Abnormal glucose complicating pregnancy: Secondary | ICD-10-CM | POA: Insufficient documentation

## 2014-06-08 DIAGNOSIS — Z3689 Encounter for other specified antenatal screening: Secondary | ICD-10-CM | POA: Insufficient documentation

## 2014-06-08 DIAGNOSIS — E669 Obesity, unspecified: Secondary | ICD-10-CM

## 2014-06-08 LAB — STREP B DNA PROBE: STREP GROUP B AG: NEGATIVE

## 2014-06-08 NOTE — Telephone Encounter (Signed)
Preadmission screen  

## 2014-06-09 ENCOUNTER — Telehealth: Payer: Self-pay | Admitting: *Deleted

## 2014-06-09 DIAGNOSIS — O99613 Diseases of the digestive system complicating pregnancy, third trimester: Secondary | ICD-10-CM

## 2014-06-09 DIAGNOSIS — B379 Candidiasis, unspecified: Secondary | ICD-10-CM

## 2014-06-09 DIAGNOSIS — K219 Gastro-esophageal reflux disease without esophagitis: Secondary | ICD-10-CM

## 2014-06-09 MED ORDER — FLUCONAZOLE 150 MG PO TABS: 150.0000 mg | ORAL_TABLET | Freq: Once | ORAL | Status: DC

## 2014-06-09 MED ORDER — OMEPRAZOLE 20 MG PO CPDR: 20.0000 mg | DELAYED_RELEASE_CAPSULE | Freq: Every day | ORAL | Status: DC

## 2014-06-09 NOTE — Telephone Encounter (Signed)
Pt called in to office regarding Diflucan Rx that was to be sent to pharmacy at last visit.  Return call to pt making her aware that Rx for Diflucan would be sent to pharmacy.  Pt also request refill of Omeprazole, advised would refill per A.Wilson SingerWren, CNM approval.

## 2014-06-12 ENCOUNTER — Other Ambulatory Visit: Payer: Self-pay | Admitting: Obstetrics

## 2014-06-12 ENCOUNTER — Inpatient Hospital Stay (HOSPITAL_COMMUNITY): Admission: RE | Admit: 2014-06-12 | Payer: No Typology Code available for payment source | Source: Ambulatory Visit

## 2014-06-12 ENCOUNTER — Encounter (HOSPITAL_COMMUNITY): Payer: Self-pay

## 2014-06-13 ENCOUNTER — Encounter (HOSPITAL_COMMUNITY): Payer: Self-pay

## 2014-06-13 ENCOUNTER — Encounter (HOSPITAL_COMMUNITY)
Admission: RE | Admit: 2014-06-13 | Discharge: 2014-06-13 | Disposition: A | Discharge status: Home / Self Care | Payer: BC Managed Care – PPO | Source: Ambulatory Visit | Attending: Obstetrics | Admitting: Obstetrics

## 2014-06-13 ENCOUNTER — Encounter (HOSPITAL_COMMUNITY): Payer: Self-pay | Admitting: Pharmacist

## 2014-06-13 ENCOUNTER — Ambulatory Visit (HOSPITAL_COMMUNITY)
Admission: RE | Admit: 2014-06-13 | Discharge: 2014-06-13 | Disposition: A | Discharge status: Home / Self Care | Payer: BC Managed Care – PPO | Source: Ambulatory Visit | Attending: Obstetrics | Admitting: Obstetrics

## 2014-06-13 HISTORY — DX: Gastro-esophageal reflux disease without esophagitis: K21.9

## 2014-06-13 HISTORY — DX: Major depressive disorder, single episode, unspecified: F32.9

## 2014-06-13 HISTORY — DX: Anxiety disorder, unspecified: F41.9

## 2014-06-13 HISTORY — DX: Depression, unspecified: F32.A

## 2014-06-13 LAB — CBC
HEMATOCRIT: 34 % — AB (ref 36.0–46.0)
HEMOGLOBIN: 11.5 g/dL — AB (ref 12.0–15.0)
MCH: 27.8 pg (ref 26.0–34.0)
MCHC: 33.8 g/dL (ref 30.0–36.0)
MCV: 82.1 fL (ref 78.0–100.0)
Platelets: 183 10*3/uL (ref 150–400)
RBC: 4.14 MIL/uL (ref 3.87–5.11)
RDW: 14.9 % (ref 11.5–15.5)
WBC: 10 10*3/uL (ref 4.0–10.5)

## 2014-06-13 LAB — BASIC METABOLIC PANEL
BUN: 9 mg/dL (ref 6–23)
CHLORIDE: 102 meq/L (ref 96–112)
CO2: 22 meq/L (ref 19–32)
Calcium: 9.2 mg/dL (ref 8.4–10.5)
Creatinine, Ser: 0.52 mg/dL (ref 0.50–1.10)
GFR calc non Af Amer: >90 mL/min (ref >90)
GLUCOSE: 147 mg/dL — AB (ref 70–99)
POTASSIUM: 3.8 meq/L (ref 3.7–5.3)
Sodium: 137 mEq/L (ref 137–147)

## 2014-06-13 LAB — RPR

## 2014-06-13 MED ORDER — CEFAZOLIN SODIUM 10 G IJ SOLR
3.0000 g | INTRAMUSCULAR | Status: AC
  Administered 2014-06-14 (×2): 3 g via INTRAVENOUS
  Filled 2014-06-13: qty 3000

## 2014-06-13 NOTE — Pre-Procedure Instructions (Signed)
Per Dr. Rodman Pickleassidy, pt inst to take 1/2 dose night time insulin tonight and none in the am. If blood sugar low in am, pt can have clear hard candy.

## 2014-06-13 NOTE — Patient Instructions (Addendum)
Your procedure is scheduled on:06/14/14  Enter through the Main Entrance at :1030 am Pick up desk phone and dial [phone] and inform us of your arrival.  Please call [phone] if you have any problems the morning of surgery.  Remember: Do not eat food or drink liquids, including water, after midnight:tonight Clear liquids are ok until:8am   You may brush your teeth the morning of surgery.  Hold Metformin for 24 hrs prior to surgery. Take 1/2 dose of night time insulin tonight.No insulin in the am of surgery.  DO NOT wear jewelry, eye make-up, lipstick,body lotion, or dark fingernail polish.  (Polished toes are ok) You may wear deodorant.  If you are to be admitted after surgery, leave suitcase in car until your room has been assigned. Patients discharged on the day of surgery will not be allowed to drive home. Wear loose fitting, comfortable clothes for your ride home.

## 2014-06-14 ENCOUNTER — Encounter (HOSPITAL_COMMUNITY): Payer: BC Managed Care – PPO | Admitting: Anesthesiology

## 2014-06-14 ENCOUNTER — Inpatient Hospital Stay (HOSPITAL_COMMUNITY)
Admission: RE | Admit: 2014-06-14 | Discharge: 2014-06-17 | DRG: 765 | Disposition: A | Discharge status: Home / Self Care | Payer: BC Managed Care – PPO | Source: Ambulatory Visit | Attending: Obstetrics | Admitting: Obstetrics

## 2014-06-14 ENCOUNTER — Encounter (HOSPITAL_COMMUNITY): Payer: Self-pay | Admitting: *Deleted

## 2014-06-14 ENCOUNTER — Inpatient Hospital Stay (HOSPITAL_COMMUNITY): Payer: BC Managed Care – PPO | Admitting: Anesthesiology

## 2014-06-14 ENCOUNTER — Encounter (HOSPITAL_COMMUNITY)
Admission: RE | Disposition: A | Discharge status: Home / Self Care | Payer: Self-pay | Source: Ambulatory Visit | Attending: Obstetrics

## 2014-06-14 DIAGNOSIS — D500 Iron deficiency anemia secondary to blood loss (chronic): Secondary | ICD-10-CM | POA: Diagnosis present

## 2014-06-14 DIAGNOSIS — O99344 Other mental disorders complicating childbirth: Secondary | ICD-10-CM | POA: Diagnosis present

## 2014-06-14 DIAGNOSIS — O2432 Unspecified pre-existing diabetes mellitus in childbirth: Secondary | ICD-10-CM | POA: Diagnosis present

## 2014-06-14 DIAGNOSIS — O3660X0 Maternal care for excessive fetal growth, unspecified trimester, not applicable or unspecified: Principal | ICD-10-CM | POA: Diagnosis present

## 2014-06-14 DIAGNOSIS — K219 Gastro-esophageal reflux disease without esophagitis: Secondary | ICD-10-CM | POA: Diagnosis present

## 2014-06-14 DIAGNOSIS — O99214 Obesity complicating childbirth: Secondary | ICD-10-CM

## 2014-06-14 DIAGNOSIS — F411 Generalized anxiety disorder: Secondary | ICD-10-CM | POA: Diagnosis present

## 2014-06-14 DIAGNOSIS — F341 Dysthymic disorder: Secondary | ICD-10-CM | POA: Diagnosis present

## 2014-06-14 DIAGNOSIS — E119 Type 2 diabetes mellitus without complications: Secondary | ICD-10-CM | POA: Diagnosis present

## 2014-06-14 DIAGNOSIS — Z87891 Personal history of nicotine dependence: Secondary | ICD-10-CM

## 2014-06-14 DIAGNOSIS — O9903 Anemia complicating the puerperium: Secondary | ICD-10-CM | POA: Diagnosis present

## 2014-06-14 DIAGNOSIS — Z6841 Body Mass Index (BMI) 40.0 and over, adult: Secondary | ICD-10-CM | POA: Diagnosis not present

## 2014-06-14 DIAGNOSIS — E669 Obesity, unspecified: Secondary | ICD-10-CM | POA: Diagnosis present

## 2014-06-14 DIAGNOSIS — Z794 Long term (current) use of insulin: Secondary | ICD-10-CM | POA: Diagnosis not present

## 2014-06-14 DIAGNOSIS — O99019 Anemia complicating pregnancy, unspecified trimester: Secondary | ICD-10-CM | POA: Diagnosis not present

## 2014-06-14 LAB — TYPE AND SCREEN
ABO/RH(D): O POS
Antibody Screen: NEGATIVE

## 2014-06-14 LAB — GLUCOSE, CAPILLARY
GLUCOSE-CAPILLARY: 91 mg/dL (ref 70–99)
Glucose-Capillary: 86 mg/dL (ref 70–99)

## 2014-06-14 SURGERY — Surgical Case
Anesthesia: Spinal | Site: Abdomen

## 2014-06-14 MED ORDER — ONDANSETRON HCL 4 MG/2ML IJ SOLN: 4.0000 mg | INTRAMUSCULAR | Status: DC | PRN

## 2014-06-14 MED ORDER — METOCLOPRAMIDE HCL 5 MG/ML IJ SOLN: 10.0000 mg | Freq: Three times a day (TID) | INTRAMUSCULAR | Status: DC | PRN

## 2014-06-14 MED ORDER — ONDANSETRON HCL 4 MG/2ML IJ SOLN
INTRAMUSCULAR | Status: DC | PRN
  Administered 2014-06-14: 4 mg via INTRAVENOUS

## 2014-06-14 MED ORDER — BUPIVACAINE IN DEXTROSE 0.75-8.25 % IT SOLN
INTRATHECAL | Status: DC | PRN
  Administered 2014-06-14: 1.6 mL via INTRATHECAL

## 2014-06-14 MED ORDER — DEXTROSE 5 % IV SOLN
INTRAVENOUS | Status: AC
  Filled 2014-06-14: qty 3000

## 2014-06-14 MED ORDER — FENTANYL CITRATE 0.05 MG/ML IJ SOLN
25.0000 ug | INTRAMUSCULAR | Status: DC | PRN
  Administered 2014-06-14: 50 ug via INTRAVENOUS

## 2014-06-14 MED ORDER — PHENYLEPHRINE 40 MCG/ML (10ML) SYRINGE FOR IV PUSH (FOR BLOOD PRESSURE SUPPORT)
PREFILLED_SYRINGE | INTRAVENOUS | Status: AC
  Filled 2014-06-14: qty 5

## 2014-06-14 MED ORDER — SODIUM CHLORIDE 0.9 % IJ SOLN: 3.0000 mL | INTRAMUSCULAR | Status: DC | PRN

## 2014-06-14 MED ORDER — MEPERIDINE HCL 25 MG/ML IJ SOLN: 6.2500 mg | INTRAMUSCULAR | Status: DC | PRN

## 2014-06-14 MED ORDER — TETANUS-DIPHTH-ACELL PERTUSSIS 5-2.5-18.5 LF-MCG/0.5 IM SUSP
0.5000 mL | Freq: Once | INTRAMUSCULAR | Status: AC
  Administered 2014-06-15: 0.5 mL via INTRAMUSCULAR
  Filled 2014-06-14: qty 0.5

## 2014-06-14 MED ORDER — ONDANSETRON HCL 4 MG PO TABS: 4.0000 mg | ORAL_TABLET | ORAL | Status: DC | PRN

## 2014-06-14 MED ORDER — NALOXONE HCL 1 MG/ML IJ SOLN: 1.0000 ug/kg/h | INTRAVENOUS | Status: DC | PRN

## 2014-06-14 MED ORDER — FENTANYL CITRATE 0.05 MG/ML IJ SOLN
INTRAMUSCULAR | Status: AC
  Filled 2014-06-14: qty 2

## 2014-06-14 MED ORDER — OXYTOCIN 40 UNITS IN LACTATED RINGERS INFUSION - SIMPLE MED: 62.5000 mL/h | INTRAVENOUS | Status: AC

## 2014-06-14 MED ORDER — NALBUPHINE HCL 10 MG/ML IJ SOLN: 5.0000 mg | INTRAMUSCULAR | Status: DC | PRN

## 2014-06-14 MED ORDER — KETOROLAC TROMETHAMINE 30 MG/ML IJ SOLN
30.0000 mg | Freq: Four times a day (QID) | INTRAMUSCULAR | Status: AC | PRN
  Administered 2014-06-14: 30 mg via INTRAVENOUS

## 2014-06-14 MED ORDER — SENNOSIDES-DOCUSATE SODIUM 8.6-50 MG PO TABS
2.0000 | ORAL_TABLET | ORAL | Status: DC
  Administered 2014-06-15 (×2): 2 via ORAL
  Filled 2014-06-14 (×3): qty 2

## 2014-06-14 MED ORDER — LACTATED RINGERS IV SOLN
INTRAVENOUS | Status: DC
  Administered 2014-06-14 (×2): via INTRAVENOUS

## 2014-06-14 MED ORDER — WITCH HAZEL-GLYCERIN EX PADS: 1.0000 "application " | MEDICATED_PAD | CUTANEOUS | Status: DC | PRN

## 2014-06-14 MED ORDER — PHENYLEPHRINE HCL 10 MG/ML IJ SOLN
INTRAMUSCULAR | Status: DC | PRN
  Administered 2014-06-14: 80 ug via INTRAVENOUS

## 2014-06-14 MED ORDER — LACTATED RINGERS IV SOLN
INTRAVENOUS | Status: DC
  Administered 2014-06-14 (×2): via INTRAVENOUS

## 2014-06-14 MED ORDER — INSULIN NPH (HUMAN) (ISOPHANE) 100 UNIT/ML ~~LOC~~ SUSP: 44.0000 [IU] | Freq: Every day | SUBCUTANEOUS | Status: DC

## 2014-06-14 MED ORDER — NALOXONE HCL 0.4 MG/ML IJ SOLN
0.4000 mg | INTRAMUSCULAR | Status: DC | PRN
Start: 2014-06-14 — End: 2014-06-17

## 2014-06-14 MED ORDER — PHENYLEPHRINE 8 MG IN D5W 100 ML (0.08MG/ML) PREMIX OPTIME
INJECTION | INTRAVENOUS | Status: AC
  Filled 2014-06-14: qty 100

## 2014-06-14 MED ORDER — IBUPROFEN 600 MG PO TABS: 600.0000 mg | ORAL_TABLET | Freq: Four times a day (QID) | ORAL | Status: DC | PRN

## 2014-06-14 MED ORDER — INSULIN ASPART 100 UNIT/ML ~~LOC~~ SOLN: 12.0000 [IU] | Freq: Every day | SUBCUTANEOUS | Status: DC

## 2014-06-14 MED ORDER — SCOPOLAMINE 1 MG/3DAYS TD PT72: 1.0000 | MEDICATED_PATCH | Freq: Once | TRANSDERMAL | Status: DC

## 2014-06-14 MED ORDER — OXYTOCIN 10 UNIT/ML IJ SOLN
40.0000 [IU] | INTRAVENOUS | Status: DC | PRN
  Administered 2014-06-14: 40 [IU] via INTRAVENOUS

## 2014-06-14 MED ORDER — LANOLIN HYDROUS EX OINT: 1.0000 "application " | TOPICAL_OINTMENT | CUTANEOUS | Status: DC | PRN

## 2014-06-14 MED ORDER — ACETAMINOPHEN 500 MG PO TABS
1000.0000 mg | ORAL_TABLET | Freq: Four times a day (QID) | ORAL | Status: AC
  Administered 2014-06-15: 1000 mg via ORAL
  Filled 2014-06-14 (×2): qty 2

## 2014-06-14 MED ORDER — LACTATED RINGERS IV SOLN
INTRAVENOUS | Status: DC | PRN
  Administered 2014-06-14: 15:00:00 via INTRAVENOUS

## 2014-06-14 MED ORDER — INSULIN ASPART 100 UNIT/ML ~~LOC~~ SOLN
14.0000 [IU] | Freq: Two times a day (BID) | SUBCUTANEOUS | Status: DC
Start: 2014-06-14 — End: 2014-06-14

## 2014-06-14 MED ORDER — MORPHINE SULFATE 0.5 MG/ML IJ SOLN
INTRAMUSCULAR | Status: AC
  Filled 2014-06-14: qty 10

## 2014-06-14 MED ORDER — LACTATED RINGERS IV SOLN
INTRAVENOUS | Status: DC
  Administered 2014-06-15: 03:00:00 via INTRAVENOUS

## 2014-06-14 MED ORDER — SIMETHICONE 80 MG PO CHEW
80.0000 mg | CHEWABLE_TABLET | ORAL | Status: DC | PRN
  Administered 2014-06-15: 80 mg via ORAL
  Filled 2014-06-14: qty 1

## 2014-06-14 MED ORDER — METFORMIN HCL 500 MG PO TABS
500.0000 mg | ORAL_TABLET | Freq: Two times a day (BID) | ORAL | Status: DC
  Filled 2014-06-14 (×3): qty 1

## 2014-06-14 MED ORDER — MAGNESIUM HYDROXIDE 400 MG/5ML PO SUSP: 30.0000 mL | ORAL | Status: DC | PRN

## 2014-06-14 MED ORDER — LACTATED RINGERS IV SOLN
Freq: Once | INTRAVENOUS | Status: AC
  Administered 2014-06-14: 11:00:00 via INTRAVENOUS

## 2014-06-14 MED ORDER — BUPIVACAINE IN DEXTROSE 0.75-8.25 % IT SOLN
INTRATHECAL | Status: AC
  Filled 2014-06-14: qty 2

## 2014-06-14 MED ORDER — ONDANSETRON HCL 4 MG/2ML IJ SOLN
INTRAMUSCULAR | Status: AC
  Filled 2014-06-14: qty 2

## 2014-06-14 MED ORDER — BUPIVACAINE HCL (PF) 0.25 % IJ SOLN
INTRAMUSCULAR | Status: AC
  Filled 2014-06-14: qty 30

## 2014-06-14 MED ORDER — DIPHENHYDRAMINE HCL 25 MG PO CAPS
25.0000 mg | ORAL_CAPSULE | ORAL | Status: DC | PRN
  Administered 2014-06-15: 25 mg via ORAL
  Filled 2014-06-14: qty 1

## 2014-06-14 MED ORDER — INSULIN ASPART 100 UNIT/ML ~~LOC~~ SOLN: 12.0000 [IU] | Freq: Three times a day (TID) | SUBCUTANEOUS | Status: DC

## 2014-06-14 MED ORDER — SCOPOLAMINE 1 MG/3DAYS TD PT72
1.0000 | MEDICATED_PATCH | Freq: Once | TRANSDERMAL | Status: DC
  Administered 2014-06-14: 1.5 mg via TRANSDERMAL

## 2014-06-14 MED ORDER — DIPHENHYDRAMINE HCL 50 MG/ML IJ SOLN
12.5000 mg | INTRAMUSCULAR | Status: DC | PRN
Start: 2014-06-14 — End: 2014-06-17

## 2014-06-14 MED ORDER — FERROUS SULFATE 325 (65 FE) MG PO TABS
325.0000 mg | ORAL_TABLET | Freq: Two times a day (BID) | ORAL | Status: DC
  Administered 2014-06-15 – 2014-06-17 (×5): 325 mg via ORAL
  Filled 2014-06-14 (×6): qty 1

## 2014-06-14 MED ORDER — MORPHINE SULFATE (PF) 0.5 MG/ML IJ SOLN
INTRAMUSCULAR | Status: DC | PRN
  Administered 2014-06-14: .1 mg via INTRATHECAL

## 2014-06-14 MED ORDER — INSULIN NPH (HUMAN) (ISOPHANE) 100 UNIT/ML ~~LOC~~ SUSP: 26.0000 [IU] | Freq: Two times a day (BID) | SUBCUTANEOUS | Status: DC

## 2014-06-14 MED ORDER — INSULIN NPH (HUMAN) (ISOPHANE) 100 UNIT/ML ~~LOC~~ SUSP
26.0000 [IU] | SUBCUTANEOUS | Status: DC
  Filled 2014-06-14: qty 10

## 2014-06-14 MED ORDER — MEASLES, MUMPS & RUBELLA VAC ~~LOC~~ INJ: 0.5000 mL | INJECTION | Freq: Once | SUBCUTANEOUS | Status: DC

## 2014-06-14 MED ORDER — IBUPROFEN 600 MG PO TABS
600.0000 mg | ORAL_TABLET | Freq: Four times a day (QID) | ORAL | Status: DC
  Administered 2014-06-15 – 2014-06-17 (×10): 600 mg via ORAL
  Filled 2014-06-14 (×10): qty 1

## 2014-06-14 MED ORDER — PHENYLEPHRINE 8 MG IN D5W 100 ML (0.08MG/ML) PREMIX OPTIME
INJECTION | INTRAVENOUS | Status: DC | PRN
  Administered 2014-06-14: 60 ug/min via INTRAVENOUS

## 2014-06-14 MED ORDER — OXYTOCIN 10 UNIT/ML IJ SOLN
INTRAMUSCULAR | Status: AC
  Filled 2014-06-14: qty 4

## 2014-06-14 MED ORDER — FENTANYL CITRATE 0.05 MG/ML IJ SOLN
INTRAMUSCULAR | Status: DC | PRN
  Administered 2014-06-14: 15 ug via INTRATHECAL

## 2014-06-14 MED ORDER — FENTANYL CITRATE 0.05 MG/ML IJ SOLN
INTRAMUSCULAR | Status: DC | PRN
  Administered 2014-06-14 (×2): 50 ug via INTRAVENOUS

## 2014-06-14 MED ORDER — INSULIN ASPART 100 UNIT/ML ~~LOC~~ SOLN
0.0000 [IU] | Freq: Three times a day (TID) | SUBCUTANEOUS | Status: DC
  Administered 2014-06-16 (×2): 3 [IU] via SUBCUTANEOUS

## 2014-06-14 MED ORDER — PRENATAL MULTIVITAMIN CH
1.0000 | ORAL_TABLET | Freq: Every day | ORAL | Status: DC
  Administered 2014-06-15 – 2014-06-16 (×2): 1 via ORAL
  Filled 2014-06-14 (×2): qty 1

## 2014-06-14 MED ORDER — MIDAZOLAM HCL 2 MG/2ML IJ SOLN: 0.5000 mg | Freq: Once | INTRAMUSCULAR | Status: DC | PRN

## 2014-06-14 MED ORDER — OXYCODONE-ACETAMINOPHEN 5-325 MG PO TABS
1.0000 | ORAL_TABLET | ORAL | Status: DC | PRN
  Administered 2014-06-15: 2 via ORAL
  Administered 2014-06-15: 1 via ORAL
  Administered 2014-06-15: 2 via ORAL
  Administered 2014-06-15: 1 via ORAL
  Administered 2014-06-16 – 2014-06-17 (×4): 2 via ORAL
  Filled 2014-06-14: qty 1
  Filled 2014-06-14 (×5): qty 2
  Filled 2014-06-14 (×3): qty 1

## 2014-06-14 MED ORDER — DIPHENHYDRAMINE HCL 50 MG/ML IJ SOLN: 25.0000 mg | INTRAMUSCULAR | Status: DC | PRN

## 2014-06-14 MED ORDER — SIMETHICONE 80 MG PO CHEW
80.0000 mg | CHEWABLE_TABLET | ORAL | Status: DC
  Administered 2014-06-15 – 2014-06-16 (×3): 80 mg via ORAL
  Filled 2014-06-14 (×3): qty 1

## 2014-06-14 MED ORDER — CEFAZOLIN SODIUM 10 G IJ SOLR
INTRAMUSCULAR | Status: AC
  Filled 2014-06-14: qty 3000

## 2014-06-14 MED ORDER — ZOLPIDEM TARTRATE 5 MG PO TABS: 5.0000 mg | ORAL_TABLET | Freq: Every evening | ORAL | Status: DC | PRN

## 2014-06-14 MED ORDER — PROMETHAZINE HCL 25 MG/ML IJ SOLN: 6.2500 mg | INTRAMUSCULAR | Status: DC | PRN

## 2014-06-14 MED ORDER — ONDANSETRON HCL 4 MG/2ML IJ SOLN: 4.0000 mg | Freq: Three times a day (TID) | INTRAMUSCULAR | Status: DC | PRN

## 2014-06-14 MED ORDER — DIPHENHYDRAMINE HCL 25 MG PO CAPS: 25.0000 mg | ORAL_CAPSULE | Freq: Four times a day (QID) | ORAL | Status: DC | PRN

## 2014-06-14 MED ORDER — KETOROLAC TROMETHAMINE 30 MG/ML IJ SOLN
INTRAMUSCULAR | Status: AC
  Administered 2014-06-14: 30 mg via INTRAVENOUS
  Filled 2014-06-14: qty 1

## 2014-06-14 MED ORDER — DIBUCAINE 1 % RE OINT: 1.0000 "application " | TOPICAL_OINTMENT | RECTAL | Status: DC | PRN

## 2014-06-14 MED ORDER — SCOPOLAMINE 1 MG/3DAYS TD PT72
MEDICATED_PATCH | TRANSDERMAL | Status: AC
  Administered 2014-06-14: 1.5 mg via TRANSDERMAL
  Filled 2014-06-14: qty 1

## 2014-06-14 MED ORDER — KETOROLAC TROMETHAMINE 30 MG/ML IJ SOLN
30.0000 mg | Freq: Four times a day (QID) | INTRAMUSCULAR | Status: AC | PRN
Start: 2014-06-14 — End: 2014-06-15

## 2014-06-14 SURGICAL SUPPLY — 45 items
ADH SKN CLS APL DERMABOND .7 (GAUZE/BANDAGES/DRESSINGS) ×1
CANISTER WOUND CARE 500ML ATS (WOUND CARE) IMPLANT
CLAMP CORD UMBIL (MISCELLANEOUS) IMPLANT
CLOTH BEACON ORANGE TIMEOUT ST (SAFETY) ×3 IMPLANT
CONTAINER PREFILL 10% NBF 15ML (MISCELLANEOUS) ×6 IMPLANT
DERMABOND ADVANCED (GAUZE/BANDAGES/DRESSINGS) ×2
DERMABOND ADVANCED .7 DNX12 (GAUZE/BANDAGES/DRESSINGS) ×1 IMPLANT
DRAPE LG THREE QUARTER DISP (DRAPES) IMPLANT
DRSG OPSITE POSTOP 4X10 (GAUZE/BANDAGES/DRESSINGS) ×3 IMPLANT
DRSG VAC ATS LRG SENSATRAC (GAUZE/BANDAGES/DRESSINGS) IMPLANT
DRSG VAC ATS MED SENSATRAC (GAUZE/BANDAGES/DRESSINGS) ×2 IMPLANT
DRSG VAC ATS SM SENSATRAC (GAUZE/BANDAGES/DRESSINGS) IMPLANT
DURAPREP 26ML APPLICATOR (WOUND CARE) ×3 IMPLANT
ELECT REM PT RETURN 9FT ADLT (ELECTROSURGICAL) ×3
ELECTRODE REM PT RTRN 9FT ADLT (ELECTROSURGICAL) ×1 IMPLANT
EXTRACTOR VACUUM M CUP 4 TUBE (SUCTIONS) IMPLANT
EXTRACTOR VACUUM M CUP 4' TUBE (SUCTIONS)
GLOVE BIO SURGEON STRL SZ8 (GLOVE) ×6 IMPLANT
GOWN STRL REUS W/TWL LRG LVL3 (GOWN DISPOSABLE) ×6 IMPLANT
KIT ABG SYR 3ML LUER SLIP (SYRINGE) IMPLANT
NDL HYPO 25X5/8 SAFETYGLIDE (NEEDLE) ×1 IMPLANT
NEEDLE HYPO 25X5/8 SAFETYGLIDE (NEEDLE) ×3 IMPLANT
NS IRRIG 1000ML POUR BTL (IV SOLUTION) ×3 IMPLANT
PACK C SECTION WH (CUSTOM PROCEDURE TRAY) ×3 IMPLANT
PAD OB MATERNITY 4.3X12.25 (PERSONAL CARE ITEMS) ×3 IMPLANT
RETRACTOR WOUND ALXS 34CM XLRG (MISCELLANEOUS) IMPLANT
RTRCTR C-SECT PINK 25CM LRG (MISCELLANEOUS) ×3 IMPLANT
RTRCTR WOUND ALEXIS 34CM XLRG (MISCELLANEOUS) ×3
STAPLER VISISTAT 35W (STAPLE) ×2 IMPLANT
SUT GUT PLAIN 0 CT-3 TAN 27 (SUTURE) IMPLANT
SUT MNCRL 0 VIOLET CTX 36 (SUTURE) ×3 IMPLANT
SUT MNCRL AB 4-0 PS2 18 (SUTURE) IMPLANT
SUT MON AB 2-0 CT1 27 (SUTURE) ×5 IMPLANT
SUT MON AB 3-0 SH 27 (SUTURE)
SUT MON AB 3-0 SH27 (SUTURE) IMPLANT
SUT MONOCRYL 0 CTX 36 (SUTURE) ×6
SUT PDS AB 0 CTX 60 (SUTURE) IMPLANT
SUT PLAIN 2 0 XLH (SUTURE) IMPLANT
SUT VIC AB 0 CTX 36 (SUTURE)
SUT VIC AB 0 CTX36XBRD ANBCTRL (SUTURE) IMPLANT
SUT VIC AB 2-0 CT1 27 (SUTURE)
SUT VIC AB 2-0 CT1 TAPERPNT 27 (SUTURE) IMPLANT
TOWEL OR 17X24 6PK STRL BLUE (TOWEL DISPOSABLE) ×3 IMPLANT
TRAY FOLEY CATH 14FR (SET/KITS/TRAYS/PACK) ×3 IMPLANT
WATER STERILE IRR 1000ML POUR (IV SOLUTION) ×3 IMPLANT

## 2014-06-14 NOTE — Consult Note (Signed)
[facility]  Delivery Note:  C-section       06/14/2014  2:27 PM  I was called to the operating room at the request of the patient's obstetrician Clearance Coots(Harper) for a pimrary c-section due to macrosomia.  PRENATAL HX:  This is a 33 y/o G2P1001 at 7639 weeks gestation [facility].  Her prenatal course has been complicated by uncontrolled IDDM and the EFW was >10 pounds upon measurement last week.    INTRAPARTUM HX:   Primary c-section with AROM at delivery  DELIVERY:  Infant was vigorous at delivery, requiring no resuscitation other than standard warming, drying and stimulation.  APGARs 8 and 9.  Infant LGA but otherwise exam within normal limits.  After 5 minutes, baby left with nurse to assist parents with skin-to-skin care.   _____________________ Electronically Signed By: Maryan CharLindsey Murphy, MD Neonatologist

## 2014-06-14 NOTE — Op Note (Signed)
Cesarean Section Procedure Note   April Chan   06/14/2014  Indications: Scheduled Proceedure/Maternal Request   Pre-operative Diagnosis: primary c-section, suspected macrosomia.   Post-operative Diagnosis: Same   Surgeon: Coral CeoHARPER,CHARLES A  Assistants: Antionette CharJackson-Moore, Lisa  Anesthesia: epidural  Procedure Details:  The patient was seen in the Holding Room. The risks, benefits, complications, treatment options, and expected outcomes were discussed with the patient. The patient concurred with the proposed plan, giving informed consent. The patient was identified as April Chan and the procedure verified as C-Section Delivery. A Time Out was held and the above information confirmed.  After induction of anesthesia, the patient was draped and prepped in the usual sterile manner. A transverse incision was made and carried down through the subcutaneous tissue to the fascia. The fascial incision was made and extended transversely. The fascia was separated from the underlying rectus tissue superiorly and inferiorly. The peritoneum was identified and entered. The peritoneal incision was extended longitudinally. The utero-vesical peritoneal reflection was incised transversely and the bladder flap was bluntly freed from the lower uterine segment. A low transverse uterine incision was made. Delivered from cephalic presentation was a 4720 gram living newborn female infant(s). APGAR (1 MIN): 8   APGAR (5 MINS): 9   APGAR (10 MINS):    A cord ph was not sent. The umbilical cord was clamped and cut cord. A sample was obtained for evaluation. The placenta was removed Intact and appeared normal.  The uterine incision was closed with running locked sutures of 1-0 Monocryl. A second imbricating layer of the same suture was placed.  Hemostasis was observed. The paracolic gutters were irrigated. The parieto peritoneum was closed in a running fashion with 2-0 Vicryl.  The fascia was then reapproximated with running  sutures of 0 Vicryl.  The skin was closed with staples.  Instrument, sponge, and needle counts were correct prior the abdominal closure and were correct at the conclusion of the case.    Findings:  Normal uterus, ovaries and tubes.   Estimated Blood Loss:  800ml  Total IV Fluids:  2300ml   Urine Output: 300CC OF clear urine  Specimens:  Placenta to pathology  Complications: no complications  Disposition: PACU - hemodynamically stable.  Maternal Condition: stable   Baby condition / location:  Couplet care / Skin to Skin    Signed: Surgeon(s): Brock Badharles A Harper, MD Antionette CharLisa Jackson-Moore, MD

## 2014-06-14 NOTE — Anesthesia Postprocedure Evaluation (Signed)
  Anesthesia Post-op Note  Anesthesia Post Note  Patient: April Chan  Procedure(s) Performed: Procedure(s) (LRB): CESAREAN SECTION (N/A)  Anesthesia type: Spinal/Epidural  Patient location: PACU  Post pain: Pain level controlled  Post assessment: Post-op Vital signs reviewed  Last Vitals:  Filed Vitals:   06/14/14 1800  BP: 102/70  Pulse:   Temp: 36.7 C  Resp: 20    Post vital signs: Reviewed  Level of consciousness: awake  Complications: No apparent anesthesia complications

## 2014-06-14 NOTE — Anesthesia Procedure Notes (Signed)
Spinal  Patient location during procedure: OR Start time: 06/14/2014 1:57 PM Staffing Performed by: anesthesiologist  Preanesthetic Checklist Completed: patient identified, site marked, surgical consent, pre-op evaluation, timeout performed, IV checked, risks and benefits discussed and monitors and equipment checked Spinal Block Patient position: sitting Prep: site prepped and draped and DuraPrep Patient monitoring: heart rate, cardiac monitor, continuous pulse ox and blood pressure Approach: midline Location: L3-4 Injection technique: single-shot Needle Needle type: Tuohy and Pencan  Needle gauge: 24 G Needle length: 9 cm Needle insertion depth: 8 cm Catheter type: closed end flexible Catheter size: 19 g Catheter at skin depth: 13 cm Assessment Sensory level: T4 Additional Notes CSE for MO and possible vertical skin incision.  LOR to air at 8 cm on first attempt.  Pencan via tuohy with immediate return of clear free flow CSF.  Pencan removed, tuohy flushed with 3 ml saline, epidural catheter threaded easily.  Tuohy removed, epid cath withdrawn to 13 cm at skin.  Pt turned to RLD for taping of catheter.  Catheter withdrawn into sub-q to 15 cm.  EPIDURAL CATHETER NOT TESTED.  Jasmine DecemberA. Cassidy, MD

## 2014-06-14 NOTE — Transfer of Care (Signed)
Immediate Anesthesia Transfer of Care Note  Patient: April PeersMary L Griffin  Procedure(s) Performed: Procedure(s): CESAREAN SECTION (N/A)  Patient Location: PACU  Anesthesia Type:Spinal  Level of Consciousness: awake  Airway & Oxygen Therapy: Patient Spontanous Breathing  Post-op Assessment: Report given to PACU RN  Post vital signs: Reviewed and stable  Complications: No apparent anesthesia complications

## 2014-06-14 NOTE — H&P (Signed)
April PeersMary L Chan is a 33 y.o. female presenting for cesarean section for macrosomia. History OB History   Grav Para Term Preterm Abortions TAB SAB Ect Mult Living   2 1 1       1      Past Medical History  Diagnosis Date  . Asthma   . Diabetes mellitus without complication     oral and insulin  . Depression     h/o pp depression  . Anxiety   . GERD (gastroesophageal reflux disease)    Past Surgical History  Procedure Laterality Date  . Tonsillectomy    . Wisdom tooth extraction Bilateral    Family History: family history includes Cancer in her paternal grandmother; Diabetes in her father, paternal grandfather, and paternal grandmother; Heart disease in her maternal grandfather and paternal grandfather; Hypertension in her father and paternal grandmother; Polycystic kidney disease in her mother. Social History:  reports that she quit smoking about 13 months ago. Her smoking use included Cigarettes. She smoked 0.25 packs per day. She does not have any smokeless tobacco history on file. She reports that she does not drink alcohol or use illicit drugs.   Prenatal Transfer Tool  Maternal Diabetes: Yes:  Diabetes Type:  Pre-pregnancy, Insulin/Medication controlled Genetic Screening: Normal Maternal Ultrasounds/Referrals: Abnormal:  Findings:   Other: Fetal Ultrasounds or other Referrals:  Referred to Materal Fetal Medicine for size>dates. Maternal Substance Abuse:  No Significant Maternal Medications:  Meds include: Other:  Insulin Significant Maternal Lab Results:  None Other Comments:  Macrosomia.  C/S recommended by MFM.  ROS    Blood pressure 119/75, pulse 108, temperature 98.2 F (36.8 C), temperature source Oral, resp. rate 18, height 5\' 7"  (1.702 m), weight 305 lb (138.347 kg), last menstrual period 08/15/2013. Exam Physical Exam  Prenatal labs: ABO, Rh: --/--/O POS, O POS (05/21 1942) Antibody: NEG (05/21 1942) Rubella: 3.29 (12/11 1755) RPR: NON REAC (06/23 1045)   HBsAg: NEGATIVE (12/11 1755)  HIV: NON REACTIVE (12/11 1755)  GBS: NEGATIVE (06/16 1559)   Assessment/Plan: 39 weeks.  A2 Diabetic.  Macrosomia.  Primary C/S, per recommendation of MFM.   HARPER,CHARLES A 06/14/2014, 11:42 AM

## 2014-06-14 NOTE — Anesthesia Preprocedure Evaluation (Signed)
Anesthesia Evaluation  Patient identified by MRN, date of birth, ID band Patient awake    Reviewed: Allergy & Precautions, H&P , NPO status , Patient's Chart, lab work & pertinent test results  Airway Mallampati: III      Dental   Pulmonary asthma , former smoker,  breath sounds clear to auscultation        Cardiovascular Exercise Tolerance: Good Rhythm:regular Rate:Normal     Neuro/Psych    GI/Hepatic GERD-  ,  Endo/Other  diabetesMorbid obesity  Renal/GU      Musculoskeletal   Abdominal   Peds  Hematology   Anesthesia Other Findings   Reproductive/Obstetrics (+) Pregnancy                           Anesthesia Physical Anesthesia Plan  ASA: III  Anesthesia Plan: Spinal   Post-op Pain Management:    Induction:   Airway Management Planned:   Additional Equipment:   Intra-op Plan:   Post-operative Plan:   Informed Consent: I have reviewed the patients History and Physical, chart, labs and discussed the procedure including the risks, benefits and alternatives for the proposed anesthesia with the patient or authorized representative who has indicated his/her understanding and acceptance.     Plan Discussed with: Anesthesiologist, CRNA and Surgeon  Anesthesia Plan Comments:         Anesthesia Quick Evaluation

## 2014-06-15 ENCOUNTER — Encounter (HOSPITAL_COMMUNITY): Payer: Self-pay | Admitting: Obstetrics

## 2014-06-15 DIAGNOSIS — O99019 Anemia complicating pregnancy, unspecified trimester: Secondary | ICD-10-CM | POA: Diagnosis not present

## 2014-06-15 LAB — GLUCOSE, CAPILLARY
GLUCOSE-CAPILLARY: 125 mg/dL — AB (ref 70–99)
GLUCOSE-CAPILLARY: 130 mg/dL — AB (ref 70–99)
Glucose-Capillary: 105 mg/dL — ABNORMAL HIGH (ref 70–99)

## 2014-06-15 LAB — CBC
HCT: 27.5 % — ABNORMAL LOW (ref 36.0–46.0)
Hemoglobin: 9.2 g/dL — ABNORMAL LOW (ref 12.0–15.0)
MCH: 27.8 pg (ref 26.0–34.0)
MCHC: 33.5 g/dL (ref 30.0–36.0)
MCV: 83.1 fL (ref 78.0–100.0)
PLATELETS: 169 10*3/uL (ref 150–400)
RBC: 3.31 MIL/uL — AB (ref 3.87–5.11)
RDW: 15 % (ref 11.5–15.5)
WBC: 10.4 10*3/uL (ref 4.0–10.5)

## 2014-06-15 MED ORDER — VALACYCLOVIR HCL 500 MG PO TABS
500.0000 mg | ORAL_TABLET | Freq: Every day | ORAL | Status: DC
  Administered 2014-06-15 – 2014-06-17 (×3): 500 mg via ORAL
  Filled 2014-06-15 (×3): qty 1

## 2014-06-15 MED ORDER — PANTOPRAZOLE SODIUM 40 MG PO TBEC
40.0000 mg | DELAYED_RELEASE_TABLET | Freq: Every day | ORAL | Status: DC
  Administered 2014-06-15 – 2014-06-17 (×3): 40 mg via ORAL
  Filled 2014-06-15 (×3): qty 1

## 2014-06-15 NOTE — Progress Notes (Signed)
Pt encouraged several times to order dinner and to call nurse before eating. Went in to ask if she had ordered food and pt states "I've already eaten two tacos and I forgot to call". Checked blood sugar which was 125. Covered meal with sliding scale insulin as ordered.

## 2014-06-15 NOTE — Anesthesia Postprocedure Evaluation (Signed)
  Anesthesia Post-op Note  Patient: April PeersMary L Chan  Procedure(s) Performed: Procedure(s): CESAREAN SECTION (N/A)  Patient Location: Mother/Baby  Anesthesia Type:Spinal  Level of Consciousness: awake  Airway and Oxygen Therapy: Patient Spontanous Breathing  Post-op Pain: none  Post-op Assessment: Patient's Cardiovascular Status Stable, Respiratory Function Stable, Patent Airway, No signs of Nausea or vomiting, Adequate PO intake, Pain level controlled, No headache, No backache, No residual numbness and No residual motor weakness  Post-op Vital Signs: Reviewed and stable  Last Vitals:  Filed Vitals:   06/15/14 0615  BP: 93/61  Pulse: 82  Temp: 36.5 C  Resp: 16    Complications: No apparent anesthesia complications

## 2014-06-15 NOTE — Lactation Note (Signed)
This note was copied from the chart of April Chan. Lactation Consultation Note  Follow up consult:  Mother called for assistance with bf.  Mother seems overwhelmed but states she really wants to bf. Mother states she has no breastmilk.  Demonstrated hand expression, good flow of colostrum both breasts. Attempt latching baby on right breast with #24 NS.  Baby unable to keep base of NS in his mouth.  Colostrum in NS. Suggested mother pump with DEBP on left side while baby breastfeeds on right with NS. Left room to get smaller #20NS and came back in and visitors in room.  Mother still pumping but baby in visitors arms. Reviewed plan of pumping every 3 hours during waking hours to maintain her milk supply if baby is not feeding for more than 10 min at a feeding. Reviewed prefilling NS with formula and to get baby in sucking pattern.  Parents seems to understand the plan. Mother states she will prefill NS after visitors leave and post pump for 15 min both breasts.  Patient Name: April Chan ZOXWR'UToday's Date: 06/15/2014 Reason for consult: Follow-up assessment   Maternal Data    Feeding Feeding Type: Breast Fed Length of feed: 5 min  LATCH Score/Interventions                      Lactation Tools Discussed/Used     Consult Status      Hardie PulleyBerkelhammer, Ruth Boschen 06/15/2014, 9:00 PM

## 2014-06-15 NOTE — Addendum Note (Signed)
Addendum created 06/15/14 0736 by Suella Groveoderick C Moore, CRNA   Modules edited: Notes Section   Notes Section:  File: [id number]

## 2014-06-15 NOTE — Progress Notes (Addendum)
Patient ID: April Chan, female   DOB: January 06, 1981, 33 y.o.   MRN: [id number] Subjective: POD# 1 s/p Cesarean Delivery.  Indications: scheduled  RH status/Rubella reviewed. Feeding: breast Patient reports tolerating PO.  Denies HA/SOB/C/P/N/V/dizziness.  Breast symptoms: no.  She reports vaginal bleeding as normal, without clots.  She is ambulating, urinating without difficulty.     Objective: Vital signs in last 24 hours: BP 93/61  Pulse 82  Temp(Src) 97.7 F (36.5 C) (Oral)  Resp 16  Ht 5\' 7"  (1.702 m)  Wt 138.347 kg (305 lb)  BMI 47.76 kg/m2  SpO2 98%  LMP 08/15/2013  Breastfeeding? Unknown       Physical Exam:  General: alert CV: Regular rate and rhythm Resp: clear Abdomen: soft, nontender, normal bowel sounds Lochia: minimal Uterine Fundus: firm, below umbilicus, nontender Incision: PICO dressing w/scant heme Ext: extremities normal, atraumatic, no cyanosis or edema and Homans sign is negative, no sign of DVT    Recent Labs  06/13/14 1045 06/15/14 0540  HGB 11.5* 9.2*  HCT 34.0* 27.5*    CBG (last 3)   Recent Labs  06/14/14 1056 06/14/14 1600 06/15/14 0022  GLUCAP 86 91 130*      Assessment/Plan: 32 y.o.  status post Cesarean section. POD# 1.   Doing well, stable.     Anemia c/w blood loss.  Stable. Type II DM--CBGs in range  Likely resume diabetic medications tomorrow          Advance diet as tolerated Start po pain meds D/C foley  HLIV  Ambulate IS Routine post-op care  JACKSON-MOORE,LISA A 06/15/2014, 8:53 AM

## 2014-06-15 NOTE — Lactation Note (Signed)
This note was copied from the chart of April Chan. Lactation Consultation Note  Patient Name: April Chan FAOZH'YToday's Date: 06/15/2014 Reason for consult: Follow-up assessment Baby 19 hours of life. Mom reports baby bites down on nipple. LC placed gloved finger in baby's mouth, baby bites off and on. Demonstrated to parents how to suck train. Enc mom to call out for assist with latching at next feeding.  Maternal Data    Feeding Feeding Type:  (Baby just nursed within last 30 minutes. ) Length of feed: 0 min (awake but no latch)  LATCH Score/Interventions                      Lactation Tools Discussed/Used     Consult Status Consult Status: Follow-up Date: 06/16/14 Follow-up type: In-patient    Geralynn OchsWILLIARD, JENNIFER 06/15/2014, 10:30 AM

## 2014-06-15 NOTE — Progress Notes (Signed)
Spoke with Diabetic Coordinator Patti S. Routh about pt not calling before eating dinner and what blood sugar was after eating. She suggested that I not give 3 units of insulin as ordered because I would be giving insulin off a blood sugar that was taken after she ate (not before) and that the sugar would probably drop really low. Suggested to recheck blood sugar after midnight to see what it is. Did not give dinner coverage as suggested.

## 2014-06-15 NOTE — Lactation Note (Signed)
This note was copied from the chart of Boy Concha SeMary Griffin. Lactation Consultation Note Baby has not been interested at all in nursing. Will not even suck on gloved finger. Noted occasional spitting clear sputum. Bulb syring used. Mom doing a lot of STS. Noted mom has wide gap between breast. No dx; of PCOS. Mom attempted to BF her 1st child who is now 8 yrs. Old, but the baby wouldn't feed well and ended up giving formula. Mom stated she never got engorged or had any milk. Denies any breast changes during this pregnancy. Breast very soft, pendulum small nipples, very compressible. Hand expression taught. Rt. Breast slightly larger than Lt. W/more glandular tissue. Noted drop of colostrum w/DEBP to both breast. Mom excited to see that. Encouraged mom to BF and then pump for 10-15 min. And if baby doesn't eat she should pump 15 min. Every three hours. Baby's mouth swabbed w/colostrum on moms finger. No jitterness noted to baby. Discussed supplementing d/t size of baby if he doesn't start BF soon. Mom stated OK. Multiple stimulation attempts to try to get baby to eat. Mom knows STS helps baby blood sugar and also her milk supply.Mom encouraged to feed baby 8-12 times/24 hours and with feeding cues. Mom encouraged to feed baby w/feeding cues. Reviewed Baby & Me book's Breastfeeding Basics. Mom shown how to use DEBP & how to disassemble, clean, & reassemble parts.WH/LC brochure given w/resources, support groups and LC services. Educated about newborn behavior. Patient Name: Boy Concha SeMary Griffin Today's Date: 06/15/2014 Reason for consult: Initial assessment   Maternal Data Infant to breast within first hour of birth: Yes Has patient been taught Hand Expression?: Yes Does the patient have breastfeeding experience prior to this delivery?: Yes  Feeding Feeding Type: Breast Fed Length of feed: 0 min  LATCH Score/Interventions Latch: Too sleepy or reluctant, no latch achieved, no sucking elicited. Intervention(s):  Skin to skin;Teach feeding cues;Waking techniques  Audible Swallowing: None Intervention(s): Skin to skin;Hand expression  Type of Nipple: Flat (compresses well/small nipples) Intervention(s): Reverse pressure;Hand pump;Double electric pump  Comfort (Breast/Nipple): Soft / non-tender     Hold (Positioning): Assistance needed to correctly position infant at breast and maintain latch. Intervention(s): Breastfeeding basics reviewed;Support Pillows;Position options;Skin to skin  LATCH Score: 4  Lactation Tools Discussed/Used Tools: Pump Breast pump type: Double-Electric Breast Pump WIC Program: No Pump Review: Setup, frequency, and cleaning;Milk Storage Initiated by:: Peri JeffersonL. Niyana Chesbro RN Date initiated:: 06/15/14   Consult Status Consult Status: Follow-up Date: 06/15/14 Follow-up type: In-patient    Charyl DancerCARVER, Denea Cheaney G 06/15/2014, 5:42 AM

## 2014-06-16 LAB — GLUCOSE, CAPILLARY
Glucose-Capillary: 131 mg/dL — ABNORMAL HIGH (ref 70–99)
Glucose-Capillary: 94 mg/dL (ref 70–99)
Glucose-Capillary: 126 mg/dL — ABNORMAL HIGH (ref 70–99)
Glucose-Capillary: 123 mg/dL — ABNORMAL HIGH (ref 70–99)

## 2014-06-16 NOTE — Progress Notes (Signed)
Subjective: Postpartum Day 2: Cesarean Delivery Patient reports tolerating PO, + flatus, + BM and no problems voiding.    Objective: Vital signs in last 24 hours: Temp:  [97.6 F (36.4 C)-98.3 F (36.8 C)] 97.6 F (36.4 C) (06/26 0518) Pulse Rate:  [71-85] 71 (06/26 0518) Resp:  [18-20] 18 (06/26 0518) BP: (93-101)/(66-68) 101/68 mmHg (06/26 0518) SpO2:  [98 %] 98 % (06/25 0950)  Physical Exam:  General: alert and cooperative Lochia: appropriate Uterine Fundus: firm Incision: healing well, no significant drainage, no dehiscence, erythema and hematoma formed above incision beneath the umbilicus, approx 10 cm long by 3.5 cm in width, under panus DVT Evaluation: No evidence of DVT seen on physical exam.   Recent Labs  06/13/14 1045 06/15/14 0540  HGB 11.5* 9.2*  HCT 34.0* 27.5*    Assessment/Plan: Status post Cesarean section. Doing well postoperatively.  Continue current care. CBGs stable, cont to monitor Reviewed PP education today [facility]certain on [facility] Plan discharge tomorrow Monitor incision and bruising above dressing  WREN, AMY 06/16/2014, 9:02 AM

## 2014-06-16 NOTE — Lactation Note (Signed)
This note was copied from the chart of April Chan. Lactation Consultation Note    Follow up consult with this mom of a term baby, weighing over 10 pounds, now 49 hours post partum. Mom is pumping with HE every 3 hours, and expressing drops of colostrum. She is formula feeding the baby for now. Mom did not ever have her milk come in with there first baby, but really wants to breast feed with this baby. She is encouraged by seeing colostrum this time. I told her she may see changes in her breasts within the next 24 hours, and briefly reviewed engorgement. Mom denied needing any help or having any questions at this time. She knows she can cal as needed.  Patient Name: April Chan UJWJX'BToday's Date: 06/16/2014 Reason for consult: Follow-up assessment   Maternal Data    Feeding    LATCH Score/Interventions                      Lactation Tools Discussed/Used     Consult Status Consult Status: Follow-up Date: 06/17/14 Follow-up type: In-patient    Alfred LevinsLee, Christine Anne 06/16/2014, 3:45 PM

## 2014-06-17 LAB — GLUCOSE, CAPILLARY
GLUCOSE-CAPILLARY: 104 mg/dL — AB (ref 70–99)
GLUCOSE-CAPILLARY: 96 mg/dL (ref 70–99)

## 2014-06-17 MED ORDER — FUSION PLUS PO CAPS: 1.0000 | ORAL_CAPSULE | Freq: Every day | ORAL | Status: DC

## 2014-06-17 MED ORDER — OXYCODONE-ACETAMINOPHEN 5-325 MG PO TABS: 1.0000 | ORAL_TABLET | ORAL | Status: DC | PRN

## 2014-06-17 MED ORDER — PNEUMOCOCCAL VAC POLYVALENT 25 MCG/0.5ML IJ INJ
0.5000 mL | INJECTION | INTRAMUSCULAR | Status: AC
  Administered 2014-06-17: 0.5 mL via INTRAMUSCULAR
  Filled 2014-06-17: qty 0.5

## 2014-06-17 MED ORDER — IBUPROFEN 600 MG PO TABS: 600.0000 mg | ORAL_TABLET | Freq: Four times a day (QID) | ORAL | Status: DC | PRN

## 2014-06-17 NOTE — Discharge Summary (Signed)
Obstetric Discharge Summary Reason for Admission: cesarean section Prenatal Procedures: ultrasound Intrapartum Procedures: cesarean: low cervical, transverse Postpartum Procedures: none Complications-Operative and Postpartum: none Hemoglobin  Date Value Ref Range Status  06/15/2014 9.2* 12.0 - 15.0 g/dL Final     DELTA CHECK NOTED     REPEATED TO VERIFY     HCT  Date Value Ref Range Status  06/15/2014 27.5* 36.0 - 46.0 % Final    Physical Exam:  General: alert and no distress Lochia: appropriate Uterine Fundus: firm Incision: healing well DVT Evaluation: No evidence of DVT seen on physical exam.  Discharge Diagnoses: Term Pregnancy-delivered  Discharge Information: Date: 06/17/2014 Activity: pelvic rest Diet: routine Medications: PNV, Ibuprofen, Colace and Percocet Condition: stable Instructions: refer to practice specific booklet Discharge to: home Follow-up Information   Follow up with HARPER,CHARLES A, MD In 4 days. (For wound re-check)    Specialty:  Obstetrics and Gynecology   Contact information:   [address] [place] [address] [phone]       Newborn Data: Live born female  Birth Weight: 10 lb 6.5 oz (4720 g) APGAR: 8, 9  Home with mother.  HARPER,CHARLES A 06/17/2014, 7:15 AM

## 2014-06-17 NOTE — Discharge Instructions (Signed)
[facility] Ask any questions about feeding, diapering, and baby care before you leave the hospital. Ask again if you do not understand. Ask when you need to see the doctor again. There are several things you must have before your baby comes home.  Infant car seat.  Crib.  Do not let your baby sleep in a bed with you or anyone else.  If you do not have a bed for your baby, ask the doctor what you can use that will be safe for the baby to sleep in. Infant feeding supplies:  6 to 8 bottles (8 oz. size).  6 to 8 nipples.  Measuring cup.  Measuring tablespoon.  Bottle brush.  Sterilizer (or use any large pan or kettle with a lid).  Formula that contains iron.  A way to boil and cool water. Breastfeeding supplies:  Breast pump.  Nipple cream. Clothing:  24 to 36 cloth diapers and waterproof diaper covers or a box of disposable diapers. You may need as many as 10 to 12 diapers per day.  3 onesies (other clothing will depend on the time of year and the weather).  3 receiving blankets.  3 baby pajamas or gowns.  3 bibs. Bath equipment:  Mild soap.  Petroleum jelly. No baby oil or powder.  Soft cloth towel and wash cloth.  Cotton balls.  Separate bath basin for baby. Only sponge bathe until umbilical cord and circumcision are healed. Other supplies:  Thermometer and bulb syringe (ask the hospital to send them home with you). Ask your doctor about how you should take your baby's temperature.  One to two pacifiers. Prepare for an emergency:  Know how to get to the hospital and know where to admit your baby.  Put all doctor numbers near your house phone and in your cell phone if you have one. Prepare your family:  Talk with siblings about the baby coming home and how they feel about it.  Decide how you want to handle visitors and other family members.  Take offers for help with the baby. You will need time to adjust. Know when to call the doctor.   GET HELP RIGHT AWAY IF:  Your baby's temperature is greater than 100.4 F (38 C).  The softspot on your baby's head starts to bulge.  Your baby is crying with no tears or has no wet diapers for 6 hours.  Your baby has rapid breathing.  Your baby is not as alert. Document Released: 11/20/2008 Document Revised: 03/01/2012 Document Reviewed: 02/27/2011 [facility] Patient Information 2015 [employer] [place]. This information is not intended to replace advice given to you by your health care provider. Make sure you discuss any questions you have with your health care provider.

## 2014-06-17 NOTE — Progress Notes (Signed)
Subjective: Postpartum Day 3: Cesarean Delivery Patient reports tolerating PO, + flatus, + BM and no problems voiding.    Objective: Vital signs in last 24 hours: Temp:  [97.5 F (36.4 C)-97.8 F (36.6 C)] 97.8 F (36.6 C) (06/27 0539) Pulse Rate:  [73] 73 (06/27 0539) Resp:  [18] 18 (06/27 0539) BP: (99-109)/(63-66) 99/66 mmHg (06/27 0539) SpO2:  [99 %] 99 % (06/27 0539)  Physical Exam:  General: alert and no distress Lochia: appropriate Uterine Fundus: firm Incision: healing well DVT Evaluation: No evidence of DVT seen on physical exam.   Recent Labs  06/15/14 0540  HGB 9.2*  HCT 27.5*    Assessment/Plan: Status post Cesarean section. Doing well postoperatively.  Discharge home with standard precautions and return to clinic in 1 weeks.  HARPER,CHARLES A 06/17/2014, 7:09 AM

## 2014-06-17 NOTE — Lactation Note (Addendum)
This note was copied from the chart of April Chan. Lactation Consultation Note  Patient Name: April Chan ZOXWR'UToday's Date: 06/17/2014 Reason for consult: Follow-up assessment Over night mom decided to pump and bottle feed , [facility] and [facility].  Per mom plans to re - latch when milk starts coming in , [facility] reviewed basics , and discussed supply an demand and the importance of consistent  Pumping at least 8 times a day for 15 -20 mins. And when pumping try not to watch the bottles. Reviewed sore nipple and engorgement prevention and treatment. Mother informed of post-discharge support and given phone number to the lactation department, including services for phone call assistance; out-patient appointments; and breastfeeding support group. List of other breastfeeding resources in the community given in the handout. Encouraged mother to call for problems or concerns related to breastfeeding. Mom  Rented a DEBP with instructions and set up. [facility]BP. Mom aware she has 14 days on her rental . Mom agreeable to LCO/P apt . Monday July 6th at 1030 am , Apt , sheet given to mom.   Maternal Data    Feeding Feeding Type: Bottle Fed - Formula Nipple Type: Slow - flow  LATCH Score/Interventions                      Lactation Tools Discussed/Used     Consult Status Consult Status: Follow-up Date: 06/26/14 Follow-up type: Out-patient    April Chan, April Chan 06/17/2014, 10:55 AM

## 2014-06-20 ENCOUNTER — Encounter: Payer: Self-pay | Admitting: Obstetrics

## 2014-06-20 ENCOUNTER — Ambulatory Visit (INDEPENDENT_AMBULATORY_CARE_PROVIDER_SITE_OTHER): Payer: BC Managed Care – PPO | Admitting: Obstetrics

## 2014-06-20 DIAGNOSIS — B3749 Other urogenital candidiasis: Secondary | ICD-10-CM | POA: Insufficient documentation

## 2014-06-20 DIAGNOSIS — IMO0001 Reserved for inherently not codable concepts without codable children: Secondary | ICD-10-CM

## 2014-06-20 DIAGNOSIS — O9081 Anemia of the puerperium: Secondary | ICD-10-CM | POA: Insufficient documentation

## 2014-06-20 MED ORDER — FLUCONAZOLE 150 MG PO TABS: 150.0000 mg | ORAL_TABLET | Freq: Once | ORAL | Status: DC

## 2014-06-20 MED ORDER — CITRANATAL HARMONY 27-1-260 MG PO CAPS: 1.0000 | ORAL_CAPSULE | Freq: Every day | ORAL | Status: DC

## 2014-06-20 NOTE — Progress Notes (Addendum)
Subjective:     April PeersMary L Chan is a 33 y.o. female who presents for a postpartum visit. She is 1 week postpartum following a low cervical transverse Cesarean section. I have fully reviewed the prenatal and intrapartum course. The delivery was at 39 gestational weeks. Outcome: primary cesarean section, low transverse incision for macrosomia. Anesthesia: spinal. Postpartum course has been normal. Baby's course has been normal. Baby is feeding by breast. Bleeding thin lochia. Bowel function is normal. Bladder function is normal. Patient is not sexually active. Contraception method is abstinence. Postpartum depression screening: positive.  The following portions of the patient's history were reviewed and updated as appropriate: allergies, current medications, past family history, past medical history, past social history, past surgical history and problem list.  Review of Systems Behavioral/Psych: positive for excessive crying and sadness.   Objective:    BP 111/74  Pulse 88  Temp(Src) 98.4 F (36.9 C)  Ht 5\' 8"  (1.727 m)  LMP 08/15/2013  Breastfeeding? Yes  General:  alert and no distress PE:  Abdomen:  Soft, NT.  Incision C, D, I.                                                           Staples removed and steri strips applied to incision.  Assessment:     Normal postpartum exam. Pap smear not done at today's visit.    Postpartum Blues.  Monitor closely for Depression.  Signs and symptoms of Depression discussed. Plan:    1. Contraception: abstinence 2. Contraceptive options discussed. 3. Follow up in: 1 week or as needed.

## 2014-06-22 ENCOUNTER — Encounter (HOSPITAL_COMMUNITY): Payer: Self-pay | Admitting: Obstetrics

## 2014-06-22 ENCOUNTER — Ambulatory Visit: Payer: BC Managed Care – PPO | Admitting: Obstetrics & Gynecology

## 2014-06-23 ENCOUNTER — Encounter (HOSPITAL_COMMUNITY): Payer: Self-pay | Admitting: *Deleted

## 2014-06-23 ENCOUNTER — Telehealth (HOSPITAL_COMMUNITY): Payer: Self-pay | Admitting: *Deleted

## 2014-06-23 ENCOUNTER — Inpatient Hospital Stay (HOSPITAL_COMMUNITY)
Admission: AD | Admit: 2014-06-23 | Discharge: 2014-06-23 | Disposition: A | Discharge status: Home / Self Care | Payer: BC Managed Care – PPO | Source: Ambulatory Visit | Attending: Obstetrics | Admitting: Obstetrics

## 2014-06-23 DIAGNOSIS — O909 Complication of the puerperium, unspecified: Secondary | ICD-10-CM | POA: Diagnosis not present

## 2014-06-23 DIAGNOSIS — Z87891 Personal history of nicotine dependence: Secondary | ICD-10-CM | POA: Diagnosis not present

## 2014-06-23 DIAGNOSIS — O99893 Other specified diseases and conditions complicating puerperium: Secondary | ICD-10-CM | POA: Diagnosis not present

## 2014-06-23 DIAGNOSIS — R300 Dysuria: Secondary | ICD-10-CM | POA: Diagnosis present

## 2014-06-23 DIAGNOSIS — O9989 Other specified diseases and conditions complicating pregnancy, childbirth and the puerperium: Secondary | ICD-10-CM

## 2014-06-23 DIAGNOSIS — K219 Gastro-esophageal reflux disease without esophagitis: Secondary | ICD-10-CM | POA: Diagnosis not present

## 2014-06-23 LAB — URINE MICROSCOPIC-ADD ON

## 2014-06-23 LAB — URINALYSIS, ROUTINE W REFLEX MICROSCOPIC
Bilirubin Urine: NEGATIVE
Glucose, UA: NEGATIVE mg/dL
Ketones, ur: NEGATIVE mg/dL
Nitrite: NEGATIVE
PROTEIN: NEGATIVE mg/dL
SPECIFIC GRAVITY, URINE: 1.015 (ref 1.005–1.030)
Urobilinogen, UA: 0.2 mg/dL (ref 0.0–1.0)
pH: 6 (ref 5.0–8.0)

## 2014-06-23 NOTE — MAU Note (Signed)
Staples removed on Tues. Wed night started to have pain with urination.  Noted an odor and yellow drainage from incision yesterday.  Hurts on rt side of incision.

## 2014-06-23 NOTE — Discharge Instructions (Signed)
Cesarean Delivery °Care After °Refer to this sheet in the next few weeks. These instructions provide you with information on caring for yourself after your procedure. Your health care provider may also give you specific instructions. Your treatment has been planned according to current medical practices, but problems sometimes occur. Call your health care provider if you have any problems or questions after you go home. °HOME CARE INSTRUCTIONS  °· Only take over-the-counter or prescription medications as directed by your health care provider. °· Do not drink alcohol, especially if you are breastfeeding or taking medication to relieve pain. °· Do not chew or smoke tobacco. °· Continue to use good perineal care. Good perineal care includes: °¨ Wiping your perineum from front to back. °¨ Keeping your perineum clean. °· Check your surgical cut (incision) daily for increased redness, drainage, swelling, or separation of skin. °· Clean your incision gently with soap and water every day, and then pat it dry. If your health care provider says it is OK, leave the incision uncovered. Use a bandage (dressing) if the incision is draining fluid or appears irritated. If the adhesive strips across the incision do not fall off within 7 days, carefully peel them off. °· Hug a pillow when coughing or sneezing until your incision is healed. This helps to relieve pain. °· Do not use tampons or douche until your health care provider says it is okay. °· Shower, wash your hair, and take tub baths as directed by your health care provider. °· Wear a well-fitting bra that provides breast support. °· Limit wearing support panties or control-top hose. °· Drink enough fluids to keep your urine clear or pale yellow. °· Eat high-fiber foods such as whole grain cereals and breads, brown rice, beans, and fresh fruits and vegetables every day. These foods may help prevent or relieve constipation. °· Resume activities such as climbing stairs,  driving, lifting, exercising, or traveling as directed by your health care provider. °· Talk to your health care provider about resuming sexual activities. This is dependent upon your risk of infection, your rate of healing, and your comfort and desire to resume sexual activity. °· Try to have someone help you with your household activities and your newborn for at least a few days after you leave the hospital. °· Rest as much as possible. Try to rest or take a nap when your newborn is sleeping. °· Increase your activities gradually. °· Keep all of your scheduled postpartum appointments. It is very important to keep your scheduled follow-up appointments. At these appointments, your health care provider will be checking to make sure that you are healing physically and emotionally. °SEEK MEDICAL CARE IF:  °· You are passing large clots from your vagina. Save any clots to show your health care provider. °· You have a foul smelling discharge from your vagina. °· You have trouble urinating. °· You are urinating frequently. °· You have pain when you urinate. °· You have a change in your bowel movements. °· You have increasing redness, pain, or swelling near your incision. °· You have pus draining from your incision. °· Your incision is separating. °· You have painful, hard, or reddened breasts. °· You have a severe headache. °· You have blurred vision or see spots. °· You feel sad or depressed. °· You have thoughts of hurting yourself or your newborn. °· You have questions about your care, the care of your newborn, or medications. °· You are dizzy or lightheaded. °· You have a rash. °· You   have pain, redness, or swelling at the site of the removed intravenous access (IV) tube. °· You have nausea or vomiting. °· You stopped breastfeeding and have not had a menstrual period within 12 weeks of stopping. °· You are not breastfeeding and have not had a menstrual period within 12 weeks of delivery. °· You have a fever. °SEEK  IMMEDIATE MEDICAL CARE IF: °· You have persistent pain. °· You have chest pain. °· You have shortness of breath. °· You faint. °· You have leg pain. °· You have stomach pain. °· Your vaginal bleeding saturates 2 or more sanitary pads in 1 hour. °MAKE SURE YOU:  °· Understand these instructions. °· Will watch your condition. °· Will get help right away if you are not doing well or get worse. °Document Released: 08/30/2002 Document Revised: 08/10/2013 Document Reviewed: 08/04/2012 °ExitCare® Patient Information ©2015 [employer], [employer]. This information is not intended to replace advice given to you by your health care provider. Make sure you discuss any questions you have with your health care provider. ° °

## 2014-06-23 NOTE — MAU Note (Signed)
Yesterday noted foul smelling draining at incision site.  Today same .  On evaluation, the steristrip are brown and loose, scant draining on pad, however she had drainage at home.  Internal discomfort with urination, UA sent

## 2014-06-23 NOTE — MAU Note (Signed)
Assisted Dr. Reola CalkinsBeck with silver nitrate to left end of incision, steri strips applied, edges were not together and small skin tag noted.Instructions given to keep incision dry by air drying and applying towels to lift abdomen from incision, pain medication offer and declined

## 2014-06-23 NOTE — MAU Provider Note (Signed)
History     CSN: [id number]  Arrival date and time: 06/23/14 1629   None     Chief Complaint  Patient presents with  . Post-op Problem   HPI  33 yo who is s/p c/s on 6/24 for suspected macrosomia.  She was stapled shut and came to the office on 6/30 and had them removed. For the last 2 days has noticed pain with urination and also noting some yellow drainage from the incision.    1) urinary symptoms - has been having some pain with urination for the last 3 days - no hematuria - no cramping - some increased frequency  No fevers, chills, vomiting, back pain.   2) ?incisional infection   - staples out 6/30 - The next night noticed some pain and some weeping from the incision - clear fluid  - no redness - has not been putting anything on it.      Past Medical History  Diagnosis Date  . Asthma   . Diabetes mellitus without complication     oral and insulin  . Depression     h/o pp depression  . Anxiety   . GERD (gastroesophageal reflux disease)     Past Surgical History  Procedure Laterality Date  . Tonsillectomy    . Wisdom tooth extraction Bilateral   . Cesarean section N/A 06/14/2014    Procedure: CESAREAN SECTION;  Surgeon: Brock Badharles A Harper, MD;  Location: [facility];  Service: Obstetrics;  Laterality: N/A;    Family History  Problem Relation Age of Onset  . Polycystic kidney disease Mother   . Hypertension Father   . Diabetes Father   . Heart disease Maternal Grandfather   . Diabetes Paternal Grandmother   . Hypertension Paternal Grandmother   . Cancer Paternal Grandmother   . Diabetes Paternal Grandfather   . Heart disease Paternal Grandfather     History  Substance Use Topics  . Smoking status: Former Smoker -- 0.25 packs/day    Types: Cigarettes    Quit date: 05/11/2013  . Smokeless tobacco: Not on file  . Alcohol Use: No    Allergies: No Known Allergies  Prescriptions prior to admission  Medication Sig Dispense Refill  . fluconazole  (DIFLUCAN) 150 MG tablet Take 1 tablet (150 mg total) by mouth once.  1 tablet  2  . ibuprofen (ADVIL,MOTRIN) 600 MG tablet Take 1 tablet (600 mg total) by mouth every 6 (six) hours as needed.  30 tablet  5  . insulin aspart (NOVOLOG) 100 UNIT/ML injection Inject 12-14 Units into the skin 3 (three) times daily before meals. Take 12 units with breakfast and lunch and 14 units with dinner.      . insulin NPH Human (HUMULIN N,NOVOLIN N) 100 UNIT/ML injection Inject 26-44 Units into the skin 2 (two) times daily. Takes 44 units at 6 am and 26 units at 9 pm.      . Iron-FA-B Cmp-C-Biot-Probiotic (FUSION PLUS) CAPS Take 1 capsule by mouth daily before breakfast.  30 capsule  5  . metFORMIN (GLUCOPHAGE) 500 MG tablet Take 1 tablet (500 mg total) by mouth 2 (two) times daily with a meal.  60 tablet  11  . omeprazole (PRILOSEC) 20 MG capsule Take 1 capsule (20 mg total) by mouth at bedtime.  30 capsule  0  . oxyCODONE-acetaminophen (PERCOCET/ROXICET) 5-325 MG per tablet Take 1-2 tablets by mouth every 4 (four) hours as needed for severe pain (moderate - severe pain).  40 tablet  0  .  Prenat-FeFmCb-DSS-FA-DHA w/o A (CITRANATAL HARMONY) 27-1-260 MG CAPS Take 1 capsule by mouth daily with breakfast.  30 capsule  11  . Prenatal Vit-Fe Fumarate-FA (PRENATAL MULTIVITAMIN) TABS tablet Take 1 tablet by mouth at bedtime.      . valACYclovir (VALTREX) 500 MG tablet Take 500 mg by mouth at bedtime. TAKE 1 TABLET BY MOUTH ONCE A DAY PATIENT NEEDS TO CALL THE OFFICE FOR AN APPOINTMENT        Review of Systems  All other systems reviewed and are negative.  Physical Exam   Blood pressure 124/77, pulse 89, temperature 98.2 F (36.8 C), temperature source Oral, resp. rate 18, last menstrual period 08/15/2013, currently breastfeeding.  Physical Exam  Constitutional: She appears well-developed.  obese  HENT:  Head: Normocephalic.  Neck: Neck supple.  Cardiovascular: Normal rate, regular rhythm and normal heart sounds.    Respiratory: Effort normal and breath sounds normal.  GI: Soft. Bowel sounds are normal.    Neurological: She is alert.  Psychiatric: She has a normal mood and affect.    MAU Course  Procedures  MDM Silver nitrate applied UA collected   Assessment and Plan   33 yo G2P2002 who is s/p pLTCS and here for dysuria and ? Incisional infection  1) dysuria - UA showing trace leuks and blood but not suspicious for UTI at this point.  - will add on urine cx.  - reassurance provided.   2) cesarean incision - overall no infection or cellulitis and no dehiscence noted. Small area of granulation tissue and serous discharge from a skin edge that is not reapproximated exactly.  - this area was treated with silver nitrate - new steri strips applied - discussed her need to use a towel to elevate her pannus from the incision in order to allow airflow to the healing incision.   Has an appt on Tuesday with Dr. Clearance CootsHarper for f/u. Advised to return sooner if redness, fevers, or worsening pain develop.    Meah Jiron L 06/23/2014, 5:17 PM

## 2014-06-25 LAB — URINE CULTURE
Colony Count: >=100000
Special Requests: NORMAL

## 2014-06-26 ENCOUNTER — Ambulatory Visit (HOSPITAL_COMMUNITY): Payer: BC Managed Care – PPO

## 2014-06-26 NOTE — Telephone Encounter (Signed)
Patient has delivered.

## 2014-06-27 ENCOUNTER — Ambulatory Visit: Payer: BC Managed Care – PPO | Admitting: Obstetrics

## 2014-06-29 ENCOUNTER — Other Ambulatory Visit: Payer: Self-pay | Admitting: Obstetrics

## 2014-06-29 DIAGNOSIS — R52 Pain, unspecified: Secondary | ICD-10-CM

## 2014-06-29 MED ORDER — OXYCODONE HCL 10 MG PO TABS: 10.0000 mg | ORAL_TABLET | Freq: Four times a day (QID) | ORAL | Status: DC | PRN

## 2014-07-07 ENCOUNTER — Other Ambulatory Visit: Payer: Self-pay | Admitting: Advanced Practice Midwife

## 2014-07-17 ENCOUNTER — Encounter: Payer: Self-pay | Admitting: Obstetrics

## 2014-07-17 ENCOUNTER — Ambulatory Visit (INDEPENDENT_AMBULATORY_CARE_PROVIDER_SITE_OTHER): Payer: BC Managed Care – PPO | Admitting: Obstetrics

## 2014-07-17 VITALS — BP 107/74 | HR 73 | Temp 97.8°F | Wt 292.0 lb

## 2014-07-17 DIAGNOSIS — R52 Pain, unspecified: Secondary | ICD-10-CM

## 2014-07-17 DIAGNOSIS — O9081 Anemia of the puerperium: Secondary | ICD-10-CM

## 2014-07-17 DIAGNOSIS — D649 Anemia, unspecified: Secondary | ICD-10-CM

## 2014-07-17 MED ORDER — OXYCODONE HCL 10 MG PO TABS: 10.0000 mg | ORAL_TABLET | Freq: Four times a day (QID) | ORAL | Status: DC | PRN

## 2014-07-17 MED ORDER — IBUPROFEN 600 MG PO TABS: 600.0000 mg | ORAL_TABLET | Freq: Four times a day (QID) | ORAL | Status: DC | PRN

## 2014-07-17 MED ORDER — VITAFOL-OB PO TABS: 1.0000 | ORAL_TABLET | Freq: Every day | ORAL | Status: AC

## 2014-07-17 NOTE — Progress Notes (Signed)
Subjective:     April PeersMary L Griffin is a 33 y.o. female who presents for a postpartum visit. She is 4 weeks postpartum following a low cervical transverse Cesarean section. I have fully reviewed the prenatal and intrapartum course. The delivery was at 39 gestational weeks. Outcome: repeat cesarean section, low transverse incision. Anesthesia: spinal. Postpartum course has been normal. Baby's course has been normal. Baby is feeding by breast. Bleeding no bleeding. Bowel function is normal. Bladder function is normal. Patient is not sexually active. Contraception method is abstinence. Postpartum depression screening: negative.  Tobacco, alcohol and substance abuse history reviewed.  Adult immunizations reviewed including TDAP, rubella and varicella.  The following portions of the patient's history were reviewed and updated as appropriate: allergies, current medications, past family history, past medical history, past social history, past surgical history and problem list.  Review of Systems A comprehensive review of systems was negative.   Objective:    BP 107/74  Pulse 73  Temp(Src) 97.8 F (36.6 C)  Wt 292 lb (132.45 kg)    PE:      Breasts:  Negative       Abdomen:  Soft, nontender.  Incision C, D, I.      Legs:  Negative Assessment:     Normal postpartum exam. Pap smear not done at today's visit.  Plan:    1. Contraception: abstinence 2. Contraceptive options discussed 3. Follow up in: 4 weeks or as needed.   2hr GTT for h/o GDM/screening for DM q 3 yrs per ADA recommendations Healthy lifestyle practices reviewed

## 2014-08-01 ENCOUNTER — Ambulatory Visit (INDEPENDENT_AMBULATORY_CARE_PROVIDER_SITE_OTHER): Payer: BC Managed Care – PPO | Admitting: Obstetrics

## 2014-08-01 ENCOUNTER — Encounter: Payer: Self-pay | Admitting: Obstetrics

## 2014-08-01 DIAGNOSIS — Z3009 Encounter for other general counseling and advice on contraception: Secondary | ICD-10-CM

## 2014-08-01 DIAGNOSIS — Z3202 Encounter for pregnancy test, result negative: Secondary | ICD-10-CM

## 2014-08-01 LAB — POCT URINE PREGNANCY: Preg Test, Ur: NEGATIVE

## 2014-08-01 MED ORDER — SERTRALINE HCL 50 MG PO TABS: 50.0000 mg | ORAL_TABLET | Freq: Every day | ORAL | Status: DC

## 2014-08-01 MED ORDER — NORETHINDRONE 0.35 MG PO TABS: 1.0000 | ORAL_TABLET | Freq: Every day | ORAL | Status: DC

## 2014-08-04 NOTE — Progress Notes (Signed)
Subjective:     April Chan is a 33 y.o. female who presents for a postpartum visit. She is 6 weeks postpartum following a low cervical transverse Cesarean section. I have fully reviewed the prenatal and intrapartum course. The delivery was at 39 gestational weeks. Outcome: repeat cesarean section, low transverse incision. Anesthesia: spinal. Postpartum course has been normal. Baby's course has been normal. Baby is feeding by breast. Bleeding no bleeding. Bowel function is normal. Bladder function is normal. Patient is not sexually active. Contraception method is abstinence. Postpartum depression screening: negative.  Tobacco, alcohol and substance abuse history reviewed.  Adult immunizations reviewed including TDAP, rubella and varicella.  The following portions of the patient's history were reviewed and updated as appropriate: allergies, current medications, past family history, past medical history, past social history, past surgical history and problem list.  Review of Systems A comprehensive review of systems was negative.   Objective:    Temp(Src) 97.4 F (36.3 C)  Ht 5\' 7"  (1.702 m)  Wt 300 lb (136.079 kg)  BMI 46.98 kg/m2  Breastfeeding? Yes  General:  alert and no distress   Breasts:  inspection negative, no nipple discharge or bleeding, no masses or nodularity palpable  Lungs: clear to auscultation bilaterally  Heart:  regular rate and rhythm, S1, S2 normal, no murmur, click, rub or gallop  Abdomen: normal findings: soft, non-tender   Vulva:  normal  Vagina: normal vagina  Cervix:  no cervical motion tenderness  Corpus: normal size, contour, position, consistency, mobility, non-tender  Adnexa:  no mass, fullness, tenderness  Rectal Exam: Not performed.         Assessment:     Normal postpartum exam. Pap smear not done at today's visit.  Plan:    1. ContraceptioConn: none 2. Contraceptive options discussed 3. Follow up in: 6 weeks or as needed.   Healthy lifestyle  practices reviewed

## 2014-08-22 ENCOUNTER — Telehealth: Payer: Self-pay | Admitting: *Deleted

## 2014-08-22 NOTE — Telephone Encounter (Signed)
Fax from pharmacy for refill of Omeprazole .     Please advise.

## 2014-09-20 ENCOUNTER — Other Ambulatory Visit: Payer: Self-pay | Admitting: Obstetrics

## 2014-09-22 NOTE — Telephone Encounter (Signed)
Please advise on refill.

## 2014-09-26 ENCOUNTER — Telehealth: Payer: Self-pay | Admitting: *Deleted

## 2014-09-26 NOTE — Telephone Encounter (Signed)
Fax from pharmacy for refill on Omeprazole 20mg .   Please advise.

## 2014-09-27 NOTE — Telephone Encounter (Signed)
OK for refill for 1 year. 

## 2014-10-02 ENCOUNTER — Other Ambulatory Visit: Payer: Self-pay | Admitting: *Deleted

## 2014-10-02 MED ORDER — OMEPRAZOLE 20 MG PO CPDR: DELAYED_RELEASE_CAPSULE | ORAL | Status: AC

## 2014-10-02 NOTE — Telephone Encounter (Signed)
Refill for 1 year sent to pharmacy

## 2014-10-02 NOTE — Progress Notes (Signed)
Refill for 1 year sent to pharmacy per Dr Clearance CootsHarper approval.

## 2014-10-23 ENCOUNTER — Encounter: Payer: Self-pay | Admitting: Obstetrics

## 2014-11-01 ENCOUNTER — Ambulatory Visit: Payer: BC Managed Care – PPO | Admitting: Obstetrics

## 2014-12-18 ENCOUNTER — Encounter: Payer: Self-pay | Admitting: *Deleted

## 2014-12-19 ENCOUNTER — Encounter: Payer: Self-pay | Admitting: Obstetrics & Gynecology

## 2014-12-29 ENCOUNTER — Other Ambulatory Visit: Payer: Self-pay | Admitting: Obstetrics

## 2015-01-29 ENCOUNTER — Encounter: Payer: Self-pay | Admitting: Obstetrics

## 2015-01-29 ENCOUNTER — Other Ambulatory Visit (INDEPENDENT_AMBULATORY_CARE_PROVIDER_SITE_OTHER): Payer: BC Managed Care – PPO

## 2015-01-29 VITALS — BP 124/82 | HR 99 | Ht 68.0 in | Wt 314.0 lb

## 2015-01-29 DIAGNOSIS — Z3200 Encounter for pregnancy test, result unknown: Secondary | ICD-10-CM

## 2015-01-29 LAB — POCT URINE PREGNANCY: PREG TEST UR: POSITIVE

## 2015-01-29 NOTE — Progress Notes (Signed)
Patient in office for a pregnancy test. Patient states she had two positive home pregnancy tests. Pregnancy test in office is positive. Patient states this is an intended pregnancy. Patient encouraged to continue to take prenatal vitamins. Patient has been scheduled for a NOB appointment.   BP 124/82 mmHg  Pulse 99  Ht 5\' 8"  (1.727 m)  Wt 314 lb (142.429 kg)  BMI 47.75 kg/m2  LMP 12/22/2014

## 2015-02-08 ENCOUNTER — Other Ambulatory Visit: Payer: Self-pay | Admitting: Obstetrics

## 2015-02-13 ENCOUNTER — Other Ambulatory Visit: Payer: Self-pay | Admitting: Obstetrics

## 2015-02-13 ENCOUNTER — Telehealth: Payer: Self-pay | Admitting: *Deleted

## 2015-02-13 DIAGNOSIS — B3731 Acute candidiasis of vulva and vagina: Secondary | ICD-10-CM

## 2015-02-13 DIAGNOSIS — B373 Candidiasis of vulva and vagina: Secondary | ICD-10-CM

## 2015-02-13 MED ORDER — CLOTRIMAZOLE 1 % EX CREA: 1.0000 "application " | TOPICAL_CREAM | Freq: Two times a day (BID) | CUTANEOUS | Status: DC

## 2015-02-21 NOTE — Telephone Encounter (Signed)
error 

## 2015-03-06 ENCOUNTER — Encounter: Payer: Self-pay | Admitting: Obstetrics

## 2015-03-21 ENCOUNTER — Encounter: Payer: Self-pay | Admitting: Obstetrics

## 2015-03-22 ENCOUNTER — Encounter: Payer: BC Managed Care – PPO | Admitting: Obstetrics

## 2015-04-20 ENCOUNTER — Telehealth: Payer: Self-pay

## 2015-04-20 NOTE — Telephone Encounter (Signed)
Gave patient [employer] pregnancy coverage beneftis per prior phone message - she will call us back once she talks to her husband and looks into adopt a mom - [facility]

## 2015-04-20 NOTE — Telephone Encounter (Signed)
Called patient with [employer] pregancy benefits - (see guarantor snapshot) 1,054 ded + 70/30 coinsurance + 39.00 past copay + current copay of 39.00 - [facility], [facility] etc - she said she would get back to us - looking into adopt-a mom, etc

## 2015-05-11 ENCOUNTER — Other Ambulatory Visit: Payer: BC Managed Care – PPO

## 2015-05-14 ENCOUNTER — Encounter: Payer: BC Managed Care – PPO | Admitting: Family Medicine

## 2015-06-04 ENCOUNTER — Other Ambulatory Visit (HOSPITAL_COMMUNITY): Payer: Self-pay | Admitting: Urology

## 2015-06-04 DIAGNOSIS — O24112 Pre-existing diabetes mellitus, type 2, in pregnancy, second trimester: Secondary | ICD-10-CM

## 2015-06-04 LAB — OB RESULTS CONSOLE HGB/HCT, BLOOD
HEMATOCRIT: 35 %
Hemoglobin: 11.3 g/dL

## 2015-06-04 LAB — OB RESULTS CONSOLE PLATELET COUNT: PLATELETS: 210 10*3/uL

## 2015-06-04 LAB — OB RESULTS CONSOLE ABO/RH: RH Type: POSITIVE

## 2015-06-04 LAB — OB RESULTS CONSOLE HIV ANTIBODY (ROUTINE TESTING)
HIV: NONREACTIVE
HIV: NONREACTIVE

## 2015-06-04 LAB — OB RESULTS CONSOLE ANTIBODY SCREEN: Antibody Screen: NEGATIVE

## 2015-06-04 LAB — OB RESULTS CONSOLE GC/CHLAMYDIA
Chlamydia: NEGATIVE
Gonorrhea: NEGATIVE

## 2015-06-04 LAB — OB RESULTS CONSOLE RUBELLA ANTIBODY, IGM: RUBELLA: IMMUNE

## 2015-06-04 LAB — OB RESULTS CONSOLE RPR: RPR: NONREACTIVE

## 2015-06-04 LAB — OB RESULTS CONSOLE HEPATITIS B SURFACE ANTIGEN: Hepatitis B Surface Ag: NEGATIVE

## 2015-06-06 ENCOUNTER — Encounter: Payer: BC Managed Care – PPO | Attending: Physician Assistant

## 2015-06-06 VITALS — Ht 67.0 in | Wt 307.5 lb

## 2015-06-06 DIAGNOSIS — O24913 Unspecified diabetes mellitus in pregnancy, third trimester: Secondary | ICD-10-CM | POA: Diagnosis not present

## 2015-06-06 DIAGNOSIS — Z713 Dietary counseling and surveillance: Secondary | ICD-10-CM | POA: Diagnosis not present

## 2015-06-11 ENCOUNTER — Ambulatory Visit: Payer: BC Managed Care – PPO

## 2015-06-12 ENCOUNTER — Ambulatory Visit (HOSPITAL_COMMUNITY)
Admission: RE | Admit: 2015-06-12 | Discharge: 2015-06-12 | Disposition: A | Discharge status: Home / Self Care | Payer: BC Managed Care – PPO | Source: Ambulatory Visit | Attending: Physician Assistant | Admitting: Physician Assistant

## 2015-06-12 ENCOUNTER — Encounter (HOSPITAL_COMMUNITY): Payer: Self-pay

## 2015-06-12 DIAGNOSIS — O3421 Maternal care for scar from previous cesarean delivery: Secondary | ICD-10-CM | POA: Diagnosis not present

## 2015-06-12 DIAGNOSIS — O99210 Obesity complicating pregnancy, unspecified trimester: Secondary | ICD-10-CM

## 2015-06-12 DIAGNOSIS — Z3A24 24 weeks gestation of pregnancy: Secondary | ICD-10-CM | POA: Diagnosis not present

## 2015-06-12 DIAGNOSIS — O0932 Supervision of pregnancy with insufficient antenatal care, second trimester: Secondary | ICD-10-CM | POA: Insufficient documentation

## 2015-06-12 DIAGNOSIS — Z6841 Body Mass Index (BMI) 40.0 and over, adult: Secondary | ICD-10-CM | POA: Diagnosis not present

## 2015-06-12 DIAGNOSIS — O24112 Pre-existing diabetes mellitus, type 2, in pregnancy, second trimester: Secondary | ICD-10-CM | POA: Insufficient documentation

## 2015-06-12 DIAGNOSIS — O24312 Unspecified pre-existing diabetes mellitus in pregnancy, second trimester: Secondary | ICD-10-CM | POA: Insufficient documentation

## 2015-06-12 DIAGNOSIS — Z3689 Encounter for other specified antenatal screening: Secondary | ICD-10-CM | POA: Insufficient documentation

## 2015-06-12 DIAGNOSIS — O99212 Obesity complicating pregnancy, second trimester: Secondary | ICD-10-CM | POA: Insufficient documentation

## 2015-06-12 NOTE — Progress Notes (Signed)
  Patient was seen on 06/06/15 for Gestational Diabetes self-management class at the [facility]. The following learning objectives were met by the patient during this course:   States the definition of Gestational Diabetes  States why dietary management is important in controlling blood glucose  Describes the effects each nutrient has on blood glucose levels  Demonstrates ability to create a balanced meal plan  Demonstrates carbohydrate counting   States when to check blood glucose levels  Demonstrates proper blood glucose monitoring techniques  States the effect of stress and exercise on blood glucose levels  States the importance of limiting caffeine and abstaining from alcohol and smoking  Blood glucose monitor given: NO, patient brought her own meter Blood glucose reading: 134 mg/dl 2 hours post meal  Patient instructed to monitor glucose levels: FBS: 60 - <90 2 hour: <120  *Patient received handouts:  Nutrition Diabetes and Pregnancy  Carbohydrate Counting List  Patient will be seen for follow-up as needed.

## 2015-06-18 ENCOUNTER — Encounter: Payer: BC Managed Care – PPO | Admitting: Obstetrics and Gynecology

## 2015-06-20 ENCOUNTER — Other Ambulatory Visit: Payer: Self-pay | Admitting: Certified Nurse Midwife

## 2015-06-28 ENCOUNTER — Encounter: Payer: Self-pay | Admitting: Family

## 2015-06-28 ENCOUNTER — Ambulatory Visit (INDEPENDENT_AMBULATORY_CARE_PROVIDER_SITE_OTHER): Payer: BC Managed Care – PPO | Admitting: Family

## 2015-06-28 VITALS — BP 106/71 | HR 93 | Temp 97.8°F | Wt 308.2 lb

## 2015-06-28 DIAGNOSIS — B001 Herpesviral vesicular dermatitis: Secondary | ICD-10-CM

## 2015-06-28 DIAGNOSIS — O09293 Supervision of pregnancy with other poor reproductive or obstetric history, third trimester: Secondary | ICD-10-CM | POA: Diagnosis not present

## 2015-06-28 DIAGNOSIS — O0932 Supervision of pregnancy with insufficient antenatal care, second trimester: Secondary | ICD-10-CM | POA: Diagnosis not present

## 2015-06-28 DIAGNOSIS — O09299 Supervision of pregnancy with other poor reproductive or obstetric history, unspecified trimester: Secondary | ICD-10-CM | POA: Insufficient documentation

## 2015-06-28 DIAGNOSIS — O09892 Supervision of other high risk pregnancies, second trimester: Secondary | ICD-10-CM

## 2015-06-28 DIAGNOSIS — F329 Major depressive disorder, single episode, unspecified: Secondary | ICD-10-CM

## 2015-06-28 DIAGNOSIS — O34219 Maternal care for unspecified type scar from previous cesarean delivery: Secondary | ICD-10-CM

## 2015-06-28 DIAGNOSIS — O24112 Pre-existing diabetes mellitus, type 2, in pregnancy, second trimester: Secondary | ICD-10-CM

## 2015-06-28 DIAGNOSIS — O99342 Other mental disorders complicating pregnancy, second trimester: Secondary | ICD-10-CM | POA: Diagnosis not present

## 2015-06-28 DIAGNOSIS — O3421 Maternal care for scar from previous cesarean delivery: Secondary | ICD-10-CM

## 2015-06-28 LAB — POCT URINALYSIS DIP (DEVICE)
Bilirubin Urine: NEGATIVE
GLUCOSE, UA: 100 mg/dL — AB
HGB URINE DIPSTICK: NEGATIVE
Ketones, ur: NEGATIVE mg/dL
Leukocytes, UA: NEGATIVE
Nitrite: NEGATIVE
PH: 5.5 (ref 5.0–8.0)
PROTEIN: NEGATIVE mg/dL
Specific Gravity, Urine: >=1.03 (ref 1.005–1.030)
UROBILINOGEN UA: 0.2 mg/dL (ref 0.0–1.0)

## 2015-06-28 MED ORDER — METFORMIN HCL 500 MG PO TABS: 500.0000 mg | ORAL_TABLET | Freq: Two times a day (BID) | ORAL | Status: AC

## 2015-06-28 NOTE — Patient Instructions (Signed)
Second Trimester of Pregnancy The second trimester is from week 13 through week 28, months 4 through 6. The second trimester is often a time when you feel your best. Your body has also adjusted to being pregnant, and you begin to feel better physically. Usually, morning sickness has lessened or quit completely, you may have more energy, and you may have an increase in appetite. The second trimester is also a time when the fetus is growing rapidly. At the end of the sixth month, the fetus is about 9 inches long and weighs about 1 pounds. You will likely begin to feel the baby move (quickening) between 18 and 20 weeks of the pregnancy. BODY CHANGES Your body goes through many changes during pregnancy. The changes vary from woman to woman.   Your weight will continue to increase. You will notice your lower abdomen bulging out.  You may begin to get stretch marks on your hips, abdomen, and breasts.  You may develop headaches that can be relieved by medicines approved by your health care provider.  You may urinate more often because the fetus is pressing on your bladder.  You may develop or continue to have heartburn as a result of your pregnancy.  You may develop constipation because certain hormones are causing the muscles that push waste through your intestines to slow down.  You may develop hemorrhoids or swollen, bulging veins (varicose veins).  You may have back pain because of the weight gain and pregnancy hormones relaxing your joints between the bones in your pelvis and as a result of a shift in weight and the muscles that support your balance.  Your breasts will continue to grow and be tender.  Your gums may bleed and may be sensitive to brushing and flossing.  Dark spots or blotches (chloasma, mask of pregnancy) may develop on your face. This will likely fade after the baby is born.  A dark line from your belly button to the pubic area (linea nigra) may appear. This will likely fade  after the baby is born.  You may have changes in your hair. These can include thickening of your hair, rapid growth, and changes in texture. Some women also have hair loss during or after pregnancy, or hair that feels dry or thin. Your hair will most likely return to normal after your baby is born. WHAT TO EXPECT AT YOUR PRENATAL VISITS During a routine prenatal visit:  You will be weighed to make sure you and the fetus are growing normally.  Your blood pressure will be taken.  Your abdomen will be measured to track your baby's growth.  The fetal heartbeat will be listened to.  Any test results from the previous visit will be discussed. Your health care provider may ask you:  How you are feeling.  If you are feeling the baby move.  If you have had any abnormal symptoms, such as leaking fluid, bleeding, severe headaches, or abdominal cramping.  If you have any questions. Other tests that may be performed during your second trimester include:  Blood tests that check for:  Low iron levels (anemia).  Gestational diabetes (between 24 and 28 weeks).  Rh antibodies.  Urine tests to check for infections, diabetes, or protein in the urine.  An ultrasound to confirm the proper growth and development of the baby.  An amniocentesis to check for possible genetic problems.  Fetal screens for spina bifida and Down syndrome. HOME CARE INSTRUCTIONS   Avoid all smoking, herbs, alcohol, and unprescribed   drugs. These chemicals affect the formation and growth of the baby.  Follow your health care provider's instructions regarding medicine use. There are medicines that are either safe or unsafe to take during pregnancy.  Exercise only as directed by your health care provider. Experiencing uterine cramps is a good sign to stop exercising.  Continue to eat regular, healthy meals.  Wear a good support bra for breast tenderness.  Do not use hot tubs, steam rooms, or saunas.  Wear your  seat belt at all times when driving.  Avoid raw meat, uncooked cheese, cat litter boxes, and soil used by cats. These carry germs that can cause birth defects in the baby.  Take your prenatal vitamins.  Try taking a stool softener (if your health care provider approves) if you develop constipation. Eat more high-fiber foods, such as fresh vegetables or fruit and whole grains. Drink plenty of fluids to keep your urine clear or pale yellow.  Take warm sitz baths to soothe any pain or discomfort caused by hemorrhoids. Use hemorrhoid cream if your health care provider approves.  If you develop varicose veins, wear support hose. Elevate your feet for 15 minutes, 3-4 times a day. Limit salt in your diet.  Avoid heavy lifting, wear low heel shoes, and practice good posture.  Rest with your legs elevated if you have leg cramps or low back pain.  Visit your dentist if you have not gone yet during your pregnancy. Use a soft toothbrush to brush your teeth and be gentle when you floss.  A sexual relationship may be continued unless your health care provider directs you otherwise.  Continue to go to all your prenatal visits as directed by your health care provider. SEEK MEDICAL CARE IF:   You have dizziness.  You have mild pelvic cramps, pelvic pressure, or nagging pain in the abdominal area.  You have persistent nausea, vomiting, or diarrhea.  You have a bad smelling vaginal discharge.  You have pain with urination. SEEK IMMEDIATE MEDICAL CARE IF:   You have a fever.  You are leaking fluid from your vagina.  You have spotting or bleeding from your vagina.  You have severe abdominal cramping or pain.  You have rapid weight gain or loss.  You have shortness of breath with chest pain.  You notice sudden or extreme swelling of your face, hands, ankles, feet, or legs.  You have not felt your baby move in over an hour.  You have severe headaches that do not go away with  medicine.  You have vision changes. Document Released: 12/02/2001 Document Revised: 12/13/2013 Document Reviewed: 02/08/2013 ExitCare Patient Information 2015 [employer]. This information is not intended to replace advice given to you by your health care provider. Make sure you discuss any questions you have with your health care provider.  

## 2015-06-28 NOTE — Progress Notes (Signed)
Here for first visit. Transferring from health department. Given prenatal education booklets. C/o cramping at times.

## 2015-06-28 NOTE — Progress Notes (Signed)
   Subjective:    April Chan is a [id number] 7631w6d being seen today for her first obstetrical visit.  Her obstetrical history is significant for macrosomia and diabetes, obesity, close pregnancy spacing, hx of csection.   Pt does not have blood glucose log today.  Reports most are under 130.  Patient does intend to breast feed. Pregnancy history fully reviewed.  Patient reports no complaints.  Filed Vitals:   06/28/15 1536  BP: 106/71  Pulse: 93  Temp: 97.8 F (36.6 C)  Weight: 308 lb 3.2 oz (139.799 kg)    HISTORY: OB History  Gravida Para Term Preterm AB SAB TAB Ectopic Multiple Living  3 2 2       2     # Outcome Date GA Lbr Len/2nd Weight Sex Delivery Anes PTL Lv  3 Current           2 Term 06/14/14 3489w3d  10 lb 6.5 oz (4.72 kg) M CS-LTranv Spinal,EPI  Y  1 Term 11/08/05 7672w0d  9 lb (4.082 kg) M Vag-Spont EPI N Y     Past Medical History  Diagnosis Date  . Asthma   . Diabetes mellitus without complication     oral and insulin  . Depression     h/o pp depression  . Anxiety   . GERD (gastroesophageal reflux disease)    Past Surgical History  Procedure Laterality Date  . Tonsillectomy    . Wisdom tooth extraction Bilateral   . Cesarean section N/A 06/14/2014    Procedure: CESAREAN SECTION;  Surgeon: Brock Badharles A Harper, MD;  Location: [facility];  Service: Obstetrics;  Laterality: N/A;   Family History  Problem Relation Age of Onset  . Polycystic kidney disease Mother   . Hypertension Father   . Diabetes Father   . Heart disease Maternal Grandfather   . Diabetes Paternal Grandmother   . Hypertension Paternal Grandmother   . Cancer Paternal Grandmother   . Diabetes Paternal Grandfather   . Heart disease Paternal Grandfather      Exam   See ob exam from prenatal records   Assessment:    Pregnancy:   34 y.o.. G3P2002 at 5031w6d wks IUP Patient Active Problem List   Diagnosis Date Noted  . Short interval between pregnancies affecting pregnancy in second  trimester, antepartum 06/28/2015  . Depression during pregnancy in second trimester 06/28/2015  . Late prenatal care affecting pregnancy in second trimester, antepartum 06/28/2015  . History of macrosomia in infant in prior pregnancy, currently pregnant 06/28/2015  . Type 2 diabetes mellitus affecting pregnancy in second trimester, antepartum   . Maternal morbid obesity, antepartum   . Candidiasis of other urogenital sites 06/20/2014  . Blues 06/20/2014  . Anemia of mother, complicating pregnancy, childbirth, or the puerperium, unspecified as to episode of care(648.20) 06/15/2014  . Cesarean delivery, without mention of indication, delivered, with or without mention of antepartum condition 06/14/2014  . Diabetes mellitus complicating pregnancy in third trimester, antepartum 05/31/2014  . Allergic rhinitis, seasonal 04/05/2014  . Gestational diabetes mellitus in pregnancy 12/29/2013  . Candidiasis of vulva and vagina 12/29/2013        Plan:     Reviewed prenatal records Prenatal vitamins. Problem list reviewed and updated.  Ultrasound discussed; fetal survey: ordered for growth 07/10/15. Explained importance of bringing blood glucose log to assist in making medical decisions.  Follow up in 1 weeks.  Marlis EdelsonKARIM, Tajay Muzzy N 06/28/2015

## 2015-06-29 LAB — TSH: TSH: 0.562 u[IU]/mL (ref 0.350–4.500)

## 2015-07-03 ENCOUNTER — Encounter: Payer: Self-pay | Admitting: *Deleted

## 2015-07-06 ENCOUNTER — Other Ambulatory Visit: Payer: Self-pay | Admitting: Advanced Practice Midwife

## 2015-07-06 DIAGNOSIS — O99213 Obesity complicating pregnancy, third trimester: Secondary | ICD-10-CM

## 2015-07-06 DIAGNOSIS — Z3A29 29 weeks gestation of pregnancy: Secondary | ICD-10-CM

## 2015-07-06 DIAGNOSIS — O24913 Unspecified diabetes mellitus in pregnancy, third trimester: Secondary | ICD-10-CM

## 2015-07-06 DIAGNOSIS — O09213 Supervision of pregnancy with history of pre-term labor, third trimester: Secondary | ICD-10-CM

## 2015-07-09 ENCOUNTER — Encounter: Payer: BC Managed Care – PPO | Admitting: Advanced Practice Midwife

## 2015-07-10 ENCOUNTER — Encounter (HOSPITAL_COMMUNITY): Payer: Self-pay

## 2015-07-10 ENCOUNTER — Ambulatory Visit (HOSPITAL_COMMUNITY)
Admission: RE | Admit: 2015-07-10 | Discharge: 2015-07-10 | Disposition: A | Discharge status: Home / Self Care | Payer: BC Managed Care – PPO | Source: Ambulatory Visit | Attending: Physician Assistant | Admitting: Physician Assistant

## 2015-07-10 DIAGNOSIS — O99213 Obesity complicating pregnancy, third trimester: Secondary | ICD-10-CM | POA: Insufficient documentation

## 2015-07-10 DIAGNOSIS — O24913 Unspecified diabetes mellitus in pregnancy, third trimester: Secondary | ICD-10-CM | POA: Insufficient documentation

## 2015-07-10 DIAGNOSIS — Z3A29 29 weeks gestation of pregnancy: Secondary | ICD-10-CM | POA: Diagnosis not present

## 2015-07-10 DIAGNOSIS — O09213 Supervision of pregnancy with history of pre-term labor, third trimester: Secondary | ICD-10-CM | POA: Insufficient documentation

## 2015-07-10 DIAGNOSIS — O24919 Unspecified diabetes mellitus in pregnancy, unspecified trimester: Secondary | ICD-10-CM | POA: Insufficient documentation

## 2015-08-01 ENCOUNTER — Other Ambulatory Visit: Payer: Self-pay | Admitting: Advanced Practice Midwife

## 2015-08-01 DIAGNOSIS — O99213 Obesity complicating pregnancy, third trimester: Secondary | ICD-10-CM

## 2015-08-01 DIAGNOSIS — Z3A32 32 weeks gestation of pregnancy: Secondary | ICD-10-CM

## 2015-08-01 DIAGNOSIS — Z3A37 37 weeks gestation of pregnancy: Secondary | ICD-10-CM

## 2015-08-01 DIAGNOSIS — Z3A38 38 weeks gestation of pregnancy: Secondary | ICD-10-CM

## 2015-08-01 DIAGNOSIS — Z3A33 33 weeks gestation of pregnancy: Secondary | ICD-10-CM

## 2015-08-01 DIAGNOSIS — Z3A36 36 weeks gestation of pregnancy: Secondary | ICD-10-CM

## 2015-08-01 DIAGNOSIS — O24319 Unspecified pre-existing diabetes mellitus in pregnancy, unspecified trimester: Secondary | ICD-10-CM

## 2015-08-01 DIAGNOSIS — O34219 Maternal care for unspecified type scar from previous cesarean delivery: Secondary | ICD-10-CM

## 2015-08-01 DIAGNOSIS — Z3A39 39 weeks gestation of pregnancy: Secondary | ICD-10-CM

## 2015-08-01 DIAGNOSIS — Z3A35 35 weeks gestation of pregnancy: Secondary | ICD-10-CM

## 2015-08-01 DIAGNOSIS — Z3A34 34 weeks gestation of pregnancy: Secondary | ICD-10-CM

## 2015-08-03 ENCOUNTER — Ambulatory Visit (HOSPITAL_COMMUNITY)
Admission: RE | Admit: 2015-08-03 | Discharge: 2015-08-03 | Disposition: A | Discharge status: Home / Self Care | Payer: BC Managed Care – PPO | Source: Ambulatory Visit | Attending: Physician Assistant | Admitting: Physician Assistant

## 2015-08-03 ENCOUNTER — Encounter (HOSPITAL_COMMUNITY): Payer: Self-pay | Admitting: Advanced Practice Midwife

## 2015-08-03 ENCOUNTER — Other Ambulatory Visit (HOSPITAL_COMMUNITY): Payer: Self-pay | Admitting: Advanced Practice Midwife

## 2015-08-03 DIAGNOSIS — Z3A32 32 weeks gestation of pregnancy: Secondary | ICD-10-CM | POA: Diagnosis not present

## 2015-08-03 DIAGNOSIS — E119 Type 2 diabetes mellitus without complications: Secondary | ICD-10-CM | POA: Diagnosis not present

## 2015-08-03 DIAGNOSIS — O24319 Unspecified pre-existing diabetes mellitus in pregnancy, unspecified trimester: Secondary | ICD-10-CM

## 2015-08-03 DIAGNOSIS — O99213 Obesity complicating pregnancy, third trimester: Secondary | ICD-10-CM | POA: Diagnosis not present

## 2015-08-03 DIAGNOSIS — O34219 Maternal care for unspecified type scar from previous cesarean delivery: Secondary | ICD-10-CM

## 2015-08-03 DIAGNOSIS — O3421 Maternal care for scar from previous cesarean delivery: Secondary | ICD-10-CM | POA: Insufficient documentation

## 2015-08-07 ENCOUNTER — Other Ambulatory Visit (HOSPITAL_COMMUNITY): Payer: BC Managed Care – PPO

## 2015-08-08 ENCOUNTER — Encounter: Payer: Self-pay | Admitting: *Deleted

## 2015-08-10 ENCOUNTER — Ambulatory Visit (HOSPITAL_COMMUNITY)
Admission: RE | Admit: 2015-08-10 | Discharge: 2015-08-10 | Disposition: A | Discharge status: Home / Self Care | Payer: BC Managed Care – PPO | Source: Ambulatory Visit | Attending: Advanced Practice Midwife | Admitting: Advanced Practice Midwife

## 2015-08-10 ENCOUNTER — Encounter (HOSPITAL_COMMUNITY): Payer: Self-pay

## 2015-08-10 DIAGNOSIS — O24319 Unspecified pre-existing diabetes mellitus in pregnancy, unspecified trimester: Secondary | ICD-10-CM | POA: Insufficient documentation

## 2015-08-10 DIAGNOSIS — O3421 Maternal care for scar from previous cesarean delivery: Secondary | ICD-10-CM | POA: Diagnosis not present

## 2015-08-10 DIAGNOSIS — Z3A33 33 weeks gestation of pregnancy: Secondary | ICD-10-CM | POA: Diagnosis not present

## 2015-08-10 DIAGNOSIS — E119 Type 2 diabetes mellitus without complications: Secondary | ICD-10-CM | POA: Insufficient documentation

## 2015-08-10 DIAGNOSIS — O99213 Obesity complicating pregnancy, third trimester: Secondary | ICD-10-CM | POA: Diagnosis not present

## 2015-08-10 DIAGNOSIS — O34219 Maternal care for unspecified type scar from previous cesarean delivery: Secondary | ICD-10-CM

## 2015-08-10 HISTORY — DX: Gestational diabetes mellitus in pregnancy, unspecified control: O24.419

## 2015-08-10 IMAGING — US US OB LIMITED
1 series · 13 of 13 positions shown · non-contrast
Comparison: none

OBSTETRICS REPORT
                      (Signed Final 06/01/2014 [DATE])

Service(s) Provided
 [HOSPITAL]                                         76815.0
Indications
 Diabetes - Gestational, A2 on insulin; fetal ECHO
 normal
 Maternal morbid obesity
Fetal Evaluation
 Num Of Fetuses:    1
 Fetal Heart Rate:  153                          bpm
 Cardiac Activity:  Observed
 Presentation:      Cephalic
 Placenta:          Posterior, above cervical
                    os
 P. Cord            Previously Visualized
 Insertion:
 Amniotic Fluid
 AFI FV:      Subjectively within normal limits
 AFI Sum:     11.05   cm       33  %Tile     Larg Pckt:    5.01  cm
 RUQ:   5.01    cm   LUQ:    2.4    cm    LLQ:   3.64    cm
Biophysical Evaluation
 Amniotic F.V:   Within normal limits       F. Tone:        Observed
 F. Movement:    Observed                   N.S.T:          Nonreactive
 F. Breathing:   Observed                   Score:          [DATE]
Gestational Age
 LMP:           41w 3d        Date:  08/15/13                 EDD:   05/22/14
 Best:          37w 4d     Det. By:  Early Ultrasound         EDD:   06/18/14
                                     (11/09/13)
Cervix Uterus Adnexa
 Cervix:       Not visualized (advanced GA >06wks)
Impression
INDICATION: 32 yr old [id number] at 39w9d with type II diabetes
 and morbid obesity for BPP secondary to nonreactive NST.

[Series 1: us ob limited · 0.25mm/px · 13 acquisitions, 13 frames shown]
[im 1/13]
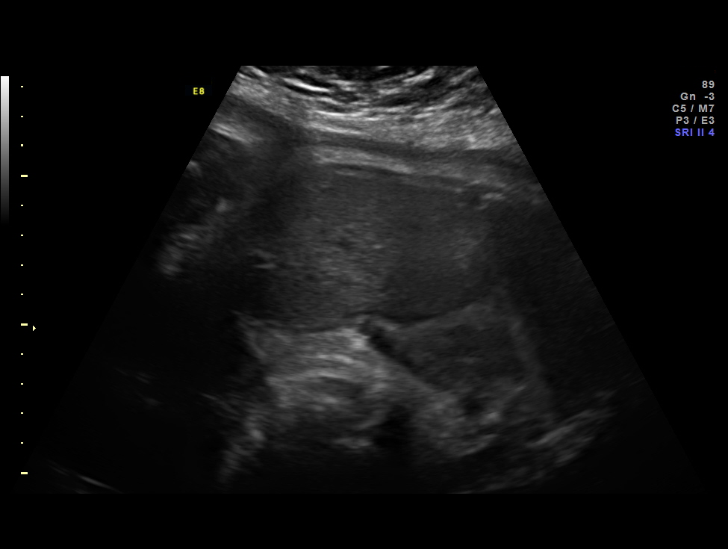
[im 2/13]
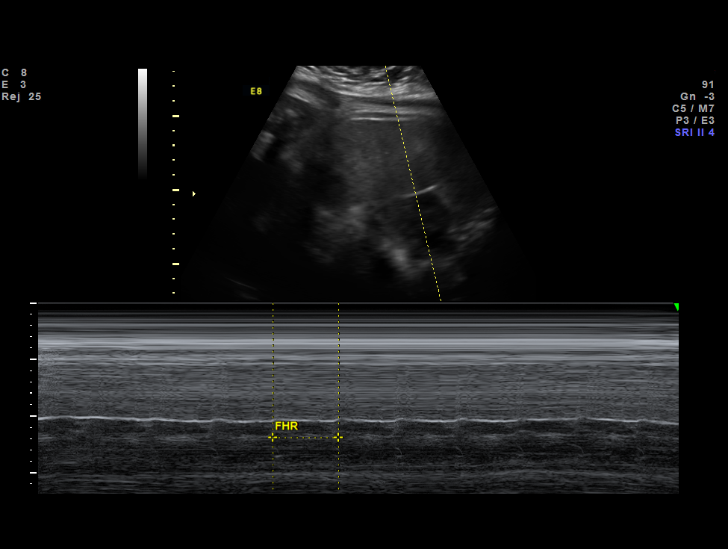
[im 3/13]
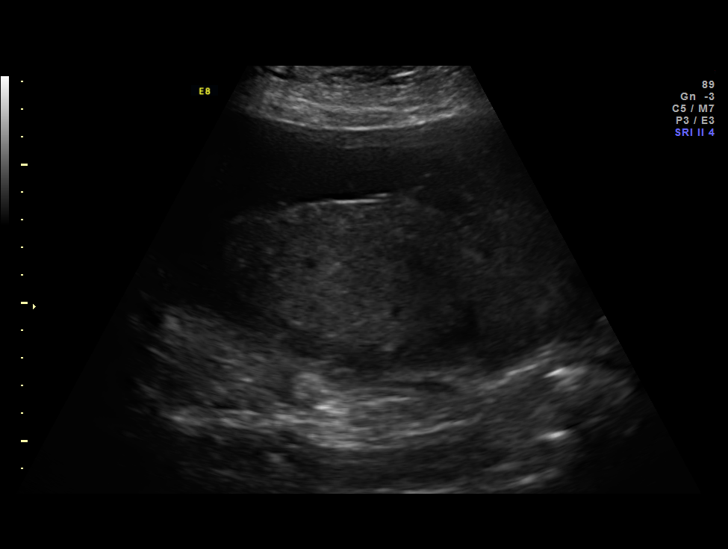
[im 4/13]
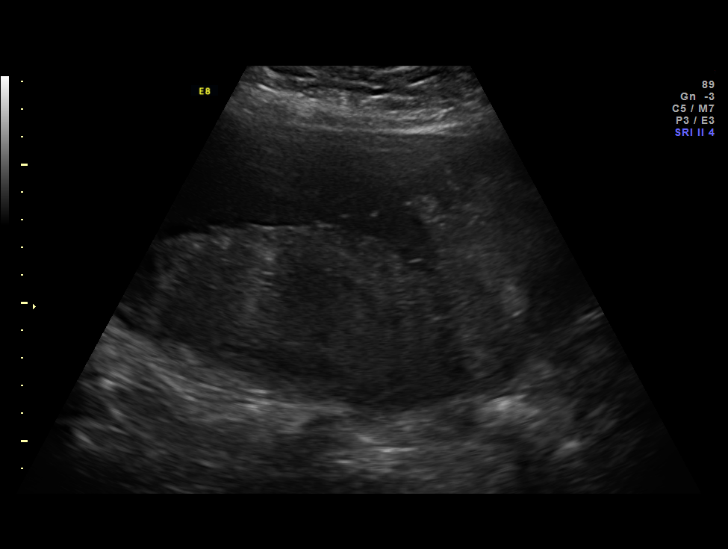
[im 5/13]
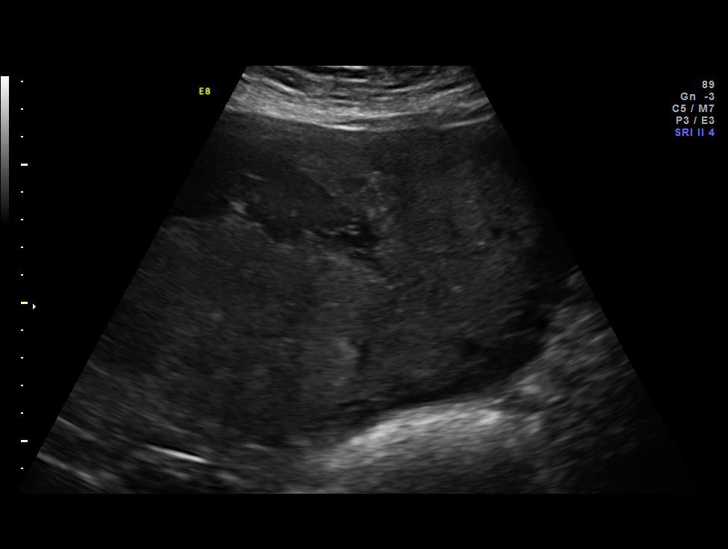
[im 6/13]
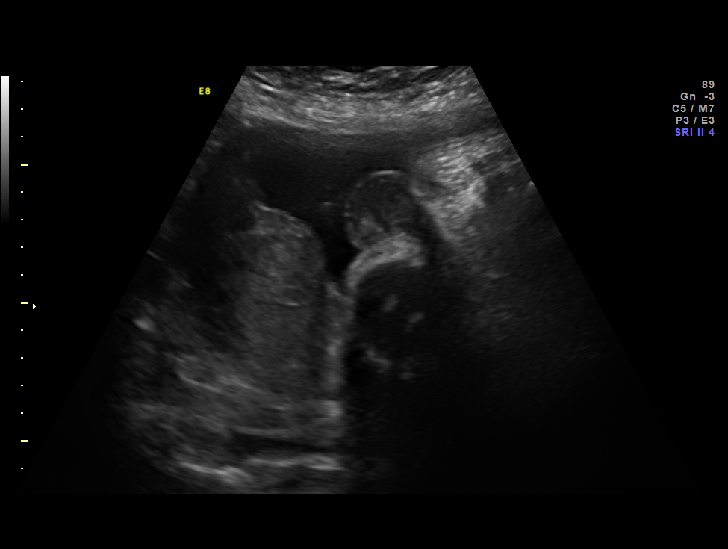
[im 7/13]
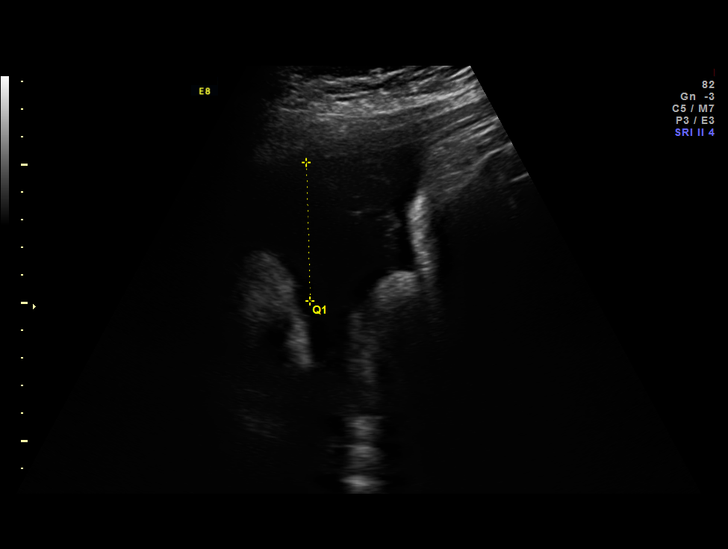
[im 8/13]
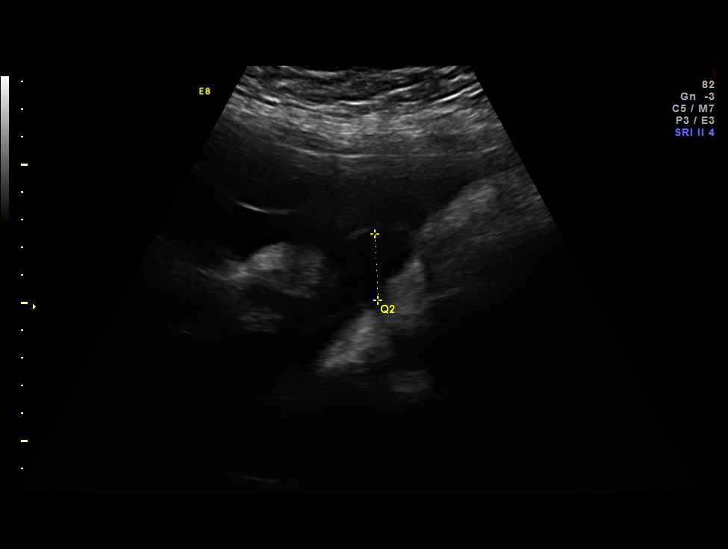
[im 9/13]
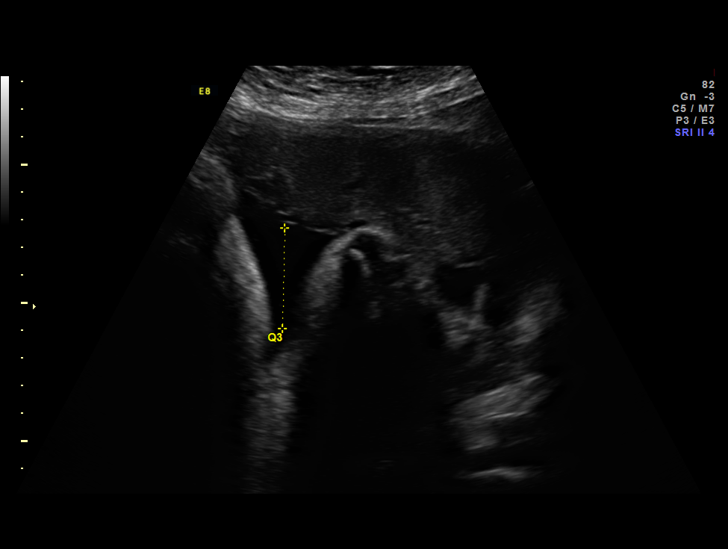
[im 10/13]
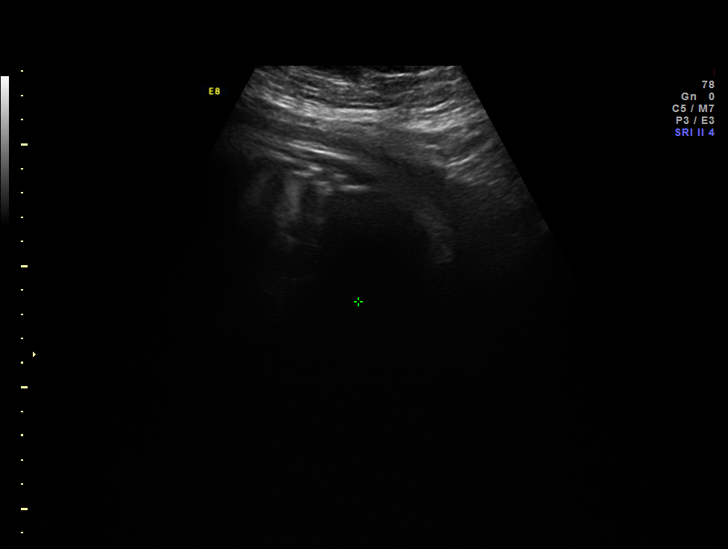
[im 11/13]
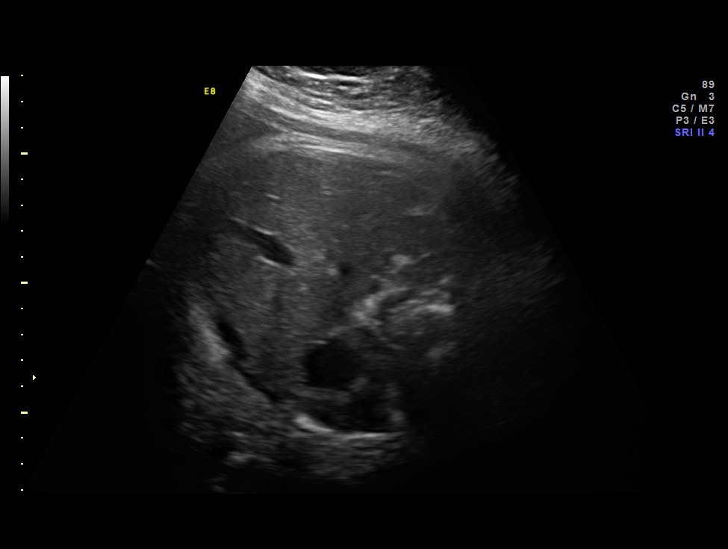
[im 12/13]
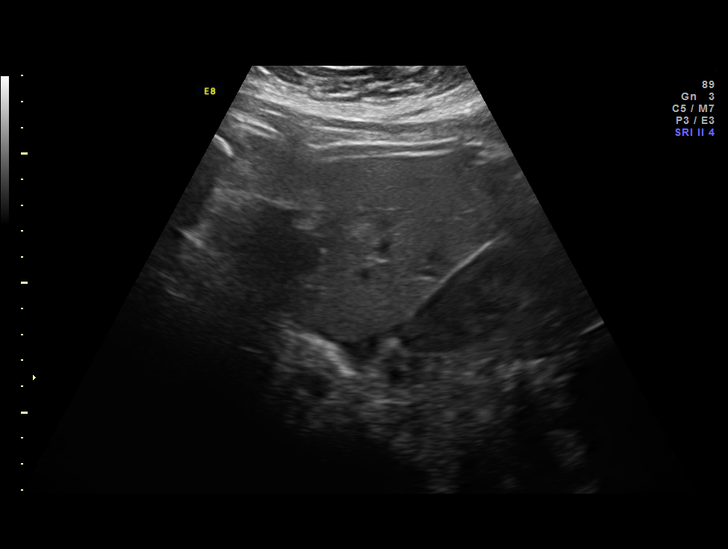
[im 13/13]
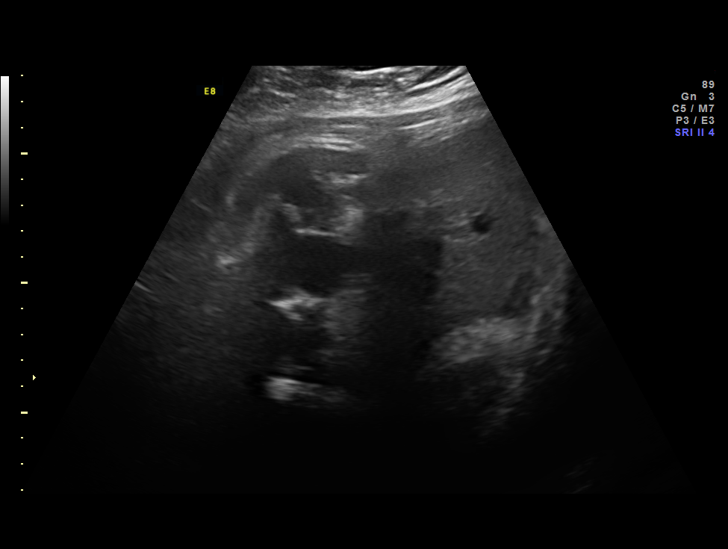

[13 of 13 positions shown; findings below may reference images not displayed]

FINDINGS: 1. Single intrauterine pregnancy.
 2. Posterior placenta without evidence of previa.
 3. Normal amniotic fluid index.
 4. Biophysical profile is normal at [DATE].
Recommendations

 1. Diabetes:
 - previously counseled
 - antenatal testing
 - recommend fetal growth every 4 weeks; due in 1 week
 - recommend delivery by estimated due date but not prior to
 39 weeks in the absence of other complications
 - had normal limited fetal echocardiogram
 - recommend strict glucose control- patient did not bring in
 sugars today; diabetic educator has counseled patient on
 importance of checking and bringing in sugar log
 3. Obesity:
 - recommend fetal surveillance as above
 - ultrasound for anatomy remains limited

 questions or concerns.

## 2015-08-13 ENCOUNTER — Ambulatory Visit (HOSPITAL_COMMUNITY): Payer: BC Managed Care – PPO | Attending: Physician Assistant

## 2015-08-13 ENCOUNTER — Ambulatory Visit (INDEPENDENT_AMBULATORY_CARE_PROVIDER_SITE_OTHER): Payer: BC Managed Care – PPO | Admitting: Obstetrics and Gynecology

## 2015-08-13 VITALS — BP 114/65 | HR 73 | Temp 97.9°F | Wt 303.5 lb

## 2015-08-13 DIAGNOSIS — O24112 Pre-existing diabetes mellitus, type 2, in pregnancy, second trimester: Secondary | ICD-10-CM

## 2015-08-13 DIAGNOSIS — O0932 Supervision of pregnancy with insufficient antenatal care, second trimester: Secondary | ICD-10-CM

## 2015-08-13 DIAGNOSIS — Z23 Encounter for immunization: Secondary | ICD-10-CM | POA: Diagnosis not present

## 2015-08-13 DIAGNOSIS — Z3493 Encounter for supervision of normal pregnancy, unspecified, third trimester: Secondary | ICD-10-CM

## 2015-08-13 DIAGNOSIS — O24319 Unspecified pre-existing diabetes mellitus in pregnancy, unspecified trimester: Secondary | ICD-10-CM

## 2015-08-13 DIAGNOSIS — E119 Type 2 diabetes mellitus without complications: Secondary | ICD-10-CM

## 2015-08-13 DIAGNOSIS — O24113 Pre-existing diabetes mellitus, type 2, in pregnancy, third trimester: Secondary | ICD-10-CM

## 2015-08-13 LAB — CBC
HCT: 33.2 % — ABNORMAL LOW (ref 36.0–46.0)
Hemoglobin: 11.3 g/dL — ABNORMAL LOW (ref 12.0–15.0)
MCH: 28 pg (ref 26.0–34.0)
MCHC: 34 g/dL (ref 30.0–36.0)
MCV: 82.4 fL (ref 78.0–100.0)
MPV: 9.1 fL (ref 8.6–12.4)
Platelets: 168 10*3/uL (ref 150–400)
RBC: 4.03 MIL/uL (ref 3.87–5.11)
RDW: 14.8 % (ref 11.5–15.5)
WBC: 10.4 10*3/uL (ref 4.0–10.5)

## 2015-08-13 LAB — POCT URINALYSIS DIP (DEVICE)
Bilirubin Urine: NEGATIVE
Glucose, UA: NEGATIVE mg/dL
Ketones, ur: 40 mg/dL — AB
Leukocytes, UA: NEGATIVE
Nitrite: NEGATIVE
Protein, ur: NEGATIVE mg/dL
Specific Gravity, Urine: 1.015 (ref 1.005–1.030)
Urobilinogen, UA: 0.2 mg/dL (ref 0.0–1.0)
pH: 6 (ref 5.0–8.0)

## 2015-08-13 LAB — COMPREHENSIVE METABOLIC PANEL
ALK PHOS: 81 U/L (ref 33–115)
ALT: 10 U/L (ref 6–29)
AST: 11 U/L (ref 10–30)
Albumin: 3.3 g/dL — ABNORMAL LOW (ref 3.6–5.1)
BUN: 6 mg/dL — AB (ref 7–25)
CHLORIDE: 103 mmol/L (ref 98–110)
CO2: 20 mmol/L (ref 20–31)
Calcium: 8.2 mg/dL — ABNORMAL LOW (ref 8.6–10.2)
Creat: 0.43 mg/dL — ABNORMAL LOW (ref 0.50–1.10)
Glucose, Bld: 132 mg/dL — ABNORMAL HIGH (ref 65–99)
Potassium: 3.9 mmol/L (ref 3.5–5.3)
Sodium: 138 mmol/L (ref 135–146)
TOTAL PROTEIN: 5.8 g/dL — AB (ref 6.1–8.1)
Total Bilirubin: 0.5 mg/dL (ref 0.2–1.2)

## 2015-08-13 LAB — HEMOGLOBIN A1C
Hgb A1c MFr Bld: 5.8 % — ABNORMAL HIGH (ref <5.7)
MEAN PLASMA GLUCOSE: 120 mg/dL — AB (ref <117)

## 2015-08-13 MED ORDER — TETANUS-DIPHTH-ACELL PERTUSSIS 5-2.5-18.5 LF-MCG/0.5 IM SUSP
0.5000 mL | Freq: Once | INTRAMUSCULAR | Status: AC
  Administered 2015-08-13: 0.5 mL via INTRAMUSCULAR

## 2015-08-13 NOTE — Progress Notes (Signed)
Subjective:  April Chan is a 34 y.o. G3P2002 at [redacted]w[redacted]d being seen today for ongoing prenatal care. fastings < 95, 2-hour pps 130s. Saw mfm, they told to increase morning metformin to 1000.   Contractions: Not present.  Vag. Bleeding: None. Movement: Present. Denies leaking of fluid.   The following portions of the patient's history were reviewed and updated as appropriate: allergies, current medications, past family history, past medical history, past social history, past surgical history and problem list.   Objective:   Filed Vitals:   08/13/15 1157  BP: 114/65  Pulse: 73  Temp: 97.9 F (36.6 C)  Weight: 303 lb 8 oz (137.667 kg)    Fetal Status: Fetal Heart Rate (bpm): 150   Movement: Present     General:  Alert, oriented and cooperative. Patient is in no acute distress.  Skin: Skin is warm and dry. No rash noted.   Cardiovascular: Normal heart rate noted  Respiratory: Normal respiratory effort, no problems with respiration noted  Abdomen: Soft, gravid, appropriate for gestational age. Pain/Pressure: Absent     Pelvic: Vag. Bleeding: None     Cervical exam deferred        Extremities: Normal range of motion.  Edema: None  Mental Status: Normal mood and affect. Normal behavior. Normal judgment and thought content.   Urinalysis:      Assessment and Plan:  Pregnancy: G3P2002 at [redacted]w[redacted]d  # Pregnancy - stern discussion of importance of routine prenatal visits, especially w/ BDM - continue pnv - cbc/hiv/rpr - tdap and flu  # TOLAC - informed consent obtained  # BDM - reports decent control, but no log - mfm told to increase morning metformin - diabetes educator - a1c - f/u one week WITH log - ophtho referral - [facility] - CMP  # Macrosomia - serial growth scans  Preterm abor symptoms and general obstetric precautions including but not limited to vaginal bleeding, contractions, leaking of fluid and fetal movement were reviewed in detail with the patient. Please refer to  After Visit Summary for other counseling recommendations.  F/u 1 week  Kathrynn Running, MD

## 2015-08-13 NOTE — Addendum Note (Signed)
Addended by: Candelaria Stagers E on: 08/13/2015 01:19 PM   Modules accepted: Orders

## 2015-08-13 NOTE — Progress Notes (Signed)
Breastfeeding tip of the week reviewed Flu vaccine given 06/12/15 Ketones:40, Hgb: trace Tdap vaccine 28 wk labs

## 2015-08-14 ENCOUNTER — Other Ambulatory Visit (HOSPITAL_COMMUNITY): Payer: BC Managed Care – PPO

## 2015-08-14 LAB — PROTEIN / CREATININE RATIO, URINE
CREATININE, URINE: 54.9 mg/dL
Protein Creatinine Ratio: 0.13 (ref <0.15)
TOTAL PROTEIN, URINE: 7 mg/dL (ref 5–24)

## 2015-08-14 LAB — HIV ANTIBODY (ROUTINE TESTING W REFLEX): HIV: NONREACTIVE

## 2015-08-14 LAB — RPR

## 2015-08-15 ENCOUNTER — Encounter: Payer: Self-pay | Admitting: *Deleted

## 2015-08-15 NOTE — Progress Notes (Signed)
FMLA completed and faxed.

## 2015-08-17 ENCOUNTER — Ambulatory Visit (HOSPITAL_COMMUNITY)
Admission: RE | Admit: 2015-08-17 | Discharge: 2015-08-17 | Disposition: A | Discharge status: Home / Self Care | Payer: BC Managed Care – PPO | Source: Ambulatory Visit | Attending: Obstetrics and Gynecology | Admitting: Obstetrics and Gynecology

## 2015-08-17 ENCOUNTER — Ambulatory Visit (HOSPITAL_COMMUNITY)
Admission: RE | Admit: 2015-08-17 | Discharge: 2015-08-17 | Disposition: A | Discharge status: Home / Self Care | Payer: BC Managed Care – PPO | Source: Ambulatory Visit | Attending: Physician Assistant | Admitting: Physician Assistant

## 2015-08-17 DIAGNOSIS — O99213 Obesity complicating pregnancy, third trimester: Secondary | ICD-10-CM | POA: Insufficient documentation

## 2015-08-17 DIAGNOSIS — Z3A34 34 weeks gestation of pregnancy: Secondary | ICD-10-CM | POA: Insufficient documentation

## 2015-08-17 DIAGNOSIS — E119 Type 2 diabetes mellitus without complications: Secondary | ICD-10-CM | POA: Insufficient documentation

## 2015-08-17 DIAGNOSIS — O3421 Maternal care for scar from previous cesarean delivery: Secondary | ICD-10-CM | POA: Diagnosis not present

## 2015-08-17 DIAGNOSIS — O24112 Pre-existing diabetes mellitus, type 2, in pregnancy, second trimester: Secondary | ICD-10-CM

## 2015-08-17 DIAGNOSIS — O24319 Unspecified pre-existing diabetes mellitus in pregnancy, unspecified trimester: Secondary | ICD-10-CM | POA: Diagnosis not present

## 2015-08-17 DIAGNOSIS — O34219 Maternal care for unspecified type scar from previous cesarean delivery: Secondary | ICD-10-CM

## 2015-08-20 ENCOUNTER — Encounter: Payer: BC Managed Care – PPO | Admitting: Family Medicine

## 2015-08-21 ENCOUNTER — Encounter: Payer: BC Managed Care – PPO | Admitting: Obstetrics and Gynecology

## 2015-08-21 ENCOUNTER — Other Ambulatory Visit (HOSPITAL_COMMUNITY): Payer: BC Managed Care – PPO

## 2015-08-24 ENCOUNTER — Other Ambulatory Visit (HOSPITAL_COMMUNITY): Payer: BC Managed Care – PPO

## 2015-08-24 ENCOUNTER — Ambulatory Visit (HOSPITAL_COMMUNITY): Admission: RE | Admit: 2015-08-24 | Payer: BC Managed Care – PPO | Source: Ambulatory Visit

## 2015-08-28 ENCOUNTER — Other Ambulatory Visit (HOSPITAL_COMMUNITY): Payer: BC Managed Care – PPO

## 2015-08-28 ENCOUNTER — Ambulatory Visit (INDEPENDENT_AMBULATORY_CARE_PROVIDER_SITE_OTHER): Payer: BC Managed Care – PPO | Admitting: Family Medicine

## 2015-08-28 VITALS — BP 121/75 | HR 104 | Temp 97.9°F | Wt 301.7 lb

## 2015-08-28 DIAGNOSIS — Z3493 Encounter for supervision of normal pregnancy, unspecified, third trimester: Secondary | ICD-10-CM | POA: Diagnosis not present

## 2015-08-28 DIAGNOSIS — O99213 Obesity complicating pregnancy, third trimester: Secondary | ICD-10-CM

## 2015-08-28 DIAGNOSIS — O09892 Supervision of other high risk pregnancies, second trimester: Secondary | ICD-10-CM

## 2015-08-28 DIAGNOSIS — O3421 Maternal care for scar from previous cesarean delivery: Secondary | ICD-10-CM

## 2015-08-28 DIAGNOSIS — O99342 Other mental disorders complicating pregnancy, second trimester: Secondary | ICD-10-CM

## 2015-08-28 DIAGNOSIS — O34219 Maternal care for unspecified type scar from previous cesarean delivery: Secondary | ICD-10-CM

## 2015-08-28 DIAGNOSIS — O0932 Supervision of pregnancy with insufficient antenatal care, second trimester: Secondary | ICD-10-CM

## 2015-08-28 DIAGNOSIS — O09623 Supervision of young multigravida, third trimester: Secondary | ICD-10-CM

## 2015-08-28 DIAGNOSIS — O0933 Supervision of pregnancy with insufficient antenatal care, third trimester: Secondary | ICD-10-CM

## 2015-08-28 DIAGNOSIS — O99343 Other mental disorders complicating pregnancy, third trimester: Secondary | ICD-10-CM

## 2015-08-28 DIAGNOSIS — O24113 Pre-existing diabetes mellitus, type 2, in pregnancy, third trimester: Secondary | ICD-10-CM

## 2015-08-28 DIAGNOSIS — F329 Major depressive disorder, single episode, unspecified: Secondary | ICD-10-CM

## 2015-08-28 DIAGNOSIS — O09629 Supervision of young multigravida, unspecified trimester: Secondary | ICD-10-CM | POA: Insufficient documentation

## 2015-08-28 DIAGNOSIS — O24112 Pre-existing diabetes mellitus, type 2, in pregnancy, second trimester: Secondary | ICD-10-CM

## 2015-08-28 DIAGNOSIS — F32A Depression, unspecified: Secondary | ICD-10-CM

## 2015-08-28 DIAGNOSIS — O09893 Supervision of other high risk pregnancies, third trimester: Secondary | ICD-10-CM

## 2015-08-28 LAB — POCT URINALYSIS DIP (DEVICE)
Glucose, UA: 500 mg/dL — AB
Hgb urine dipstick: NEGATIVE
Leukocytes, UA: NEGATIVE
Nitrite: NEGATIVE
PH: 5.5 (ref 5.0–8.0)
PROTEIN: NEGATIVE mg/dL
UROBILINOGEN UA: 0.2 mg/dL (ref 0.0–1.0)

## 2015-08-28 MED ORDER — GLYBURIDE 2.5 MG PO TABS: 2.5000 mg | ORAL_TABLET | Freq: Two times a day (BID) | ORAL | Status: DC

## 2015-08-28 NOTE — Progress Notes (Signed)
Subjective:  April Chan is a 34 y.o. G3P2002 at [redacted]w[redacted]d being seen today for ongoing prenatal care.  Patient reports elevated glucose .  Contractions: Not present.  Vag. Bleeding: None. Movement: Present. Denies leaking of fluid.   Blood sugars are high Reports compliance with diet.  Foot and leg swelling over the weekend Reports polydipsia and dry mouth  The following portions of the patient's history were reviewed and updated as appropriate: allergies, current medications, past family history, past medical history, past social history, past surgical history and problem list.   Objective:   Filed Vitals:   08/28/15 1601  BP: 121/75  Pulse: 104  Temp: 97.9 F (36.6 C)  Weight: 301 lb 11.2 oz (136.85 kg)    Fetal Status: Fetal Heart Rate (bpm): 145   Movement: Present     General:  Alert, oriented and cooperative. Patient is in no acute distress.  Skin: Skin is warm and dry. No rash noted.   Cardiovascular: Normal heart rate noted  Respiratory: Normal respiratory effort, no problems with respiration noted  Abdomen: Soft, gravid, appropriate for gestational age. Pain/Pressure: Present     Pelvic: Vag. Bleeding: None     Cervical exam deferred        Extremities: Normal range of motion.  Edema: Trace  Mental Status: Normal mood and affect. Normal behavior. Normal judgment and thought content.   Urinalysis: Urine Protein: Negative Urine Glucose: 3+  Fasting 97, 86, 107, 103,107, 97 After Lunch 110,112,182,157,174, 148 After Dinner [phone], 152  Assessment and Plan:  Pregnancy: G3P2002 at [redacted]w[redacted]d  1. Prenatal care in third trimester - Reviewed OB overview - Culture, beta strep (group b only) - GC/Chlamydia probe amp ([place])not at [facility]  2. Previous cesarean delivery affecting pregnancy, antepartum -desires repeat CS, will have patient scheduled for 39 wk CS- sent message to marni/[place] to schedule.   3. 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 Late prenatal care affecting pregnancy in second trimester, antepartum 5. Short interval between pregnancies affecting pregnancy in second trimester, antepartum -less than 18 months since CS (06/14/2014)  6. Maternal morbid obesity, antepartum, third trimester - Large pannus, will likely affect wound healing. Recommend [facility] for post-op  7. Type 2 diabetes mellitus affecting pregnancy in second trimester, antepartum - Will start glyburide 2.5mg  BID - Follow up in 1 week, likely will need up-titration. Of note, patient was on insulin in prior pregnancy  Preterm labor symptoms and general obstetric precautions including but not limited to vaginal bleeding, contractions, leaking of fluid and fetal movement were reviewed in detail with the patient. Please refer to After Visit Summary for other counseling recommendations.  Return in about 1 week (around 09/04/2015) for Routine prenatal care- follow up DM.   Federico Flake, MD

## 2015-08-28 NOTE — Progress Notes (Signed)
Breastfeeding tip of the week reviewed Pt is worried about blood sugars, pt has decided for repeat cesarean section Bilirubin small, ketones trace Cultures today

## 2015-08-28 NOTE — Patient Instructions (Signed)
Third Trimester of Pregnancy The third trimester is from week 29 through week 42, months 7 through 9. The third trimester is a time when the fetus is growing rapidly. At the end of the ninth month, the fetus is about 20 inches in length and weighs 6-10 pounds.  BODY CHANGES Your body goes through many changes during pregnancy. The changes vary from woman to woman.   Your weight will continue to increase. You can expect to gain 25-35 pounds (11-16 kg) by the end of the pregnancy.  You may begin to get stretch marks on your hips, abdomen, and breasts.  You may urinate more often because the fetus is moving lower into your pelvis and pressing on your bladder.  You may develop or continue to have heartburn as a result of your pregnancy.  You may develop constipation because certain hormones are causing the muscles that push waste through your intestines to slow down.  You may develop hemorrhoids or swollen, bulging veins (varicose veins).  You may have pelvic pain because of the weight gain and pregnancy hormones relaxing your joints between the bones in your pelvis. Backaches may result from overexertion of the muscles supporting your posture.  You may have changes in your hair. These can include thickening of your hair, rapid growth, and changes in texture. Some women also have hair loss during or after pregnancy, or hair that feels dry or thin. Your hair will most likely return to normal after your baby is born.  Your breasts will continue to grow and be tender. A yellow discharge may leak from your breasts called colostrum.  Your belly button may stick out.  You may feel short of breath because of your expanding uterus.  You may notice the fetus "dropping," or moving lower in your abdomen.  You may have a bloody mucus discharge. This usually occurs a few days to a week before labor begins.  Your cervix becomes thin and soft (effaced) near your due date. WHAT TO EXPECT AT YOUR PRENATAL  EXAMS  You will have prenatal exams every 2 weeks until week 36. Then, you will have weekly prenatal exams. During a routine prenatal visit:  You will be weighed to make sure you and the fetus are growing normally.  Your blood pressure is taken.  Your abdomen will be measured to track your baby's growth.  The fetal heartbeat will be listened to.  Any test results from the previous visit will be discussed.  You may have a cervical check near your due date to see if you have effaced. At around 36 weeks, your caregiver will check your cervix. At the same time, your caregiver will also perform a test on the secretions of the vaginal tissue. This test is to determine if a type of bacteria, Group B streptococcus, is present. Your caregiver will explain this further. Your caregiver may ask you:  What your birth plan is.  How you are feeling.  If you are feeling the baby move.  If you have had any abnormal symptoms, such as leaking fluid, bleeding, severe headaches, or abdominal cramping.  If you have any questions. Other tests or screenings that may be performed during your third trimester include:  Blood tests that check for low iron levels (anemia).  Fetal testing to check the health, activity level, and growth of the fetus. Testing is done if you have certain medical conditions or if there are problems during the pregnancy. FALSE LABOR You may feel small, irregular contractions that   eventually go away. These are called Braxton Hicks contractions, or false labor. Contractions may last for hours, days, or even weeks before true labor sets in. If contractions come at regular intervals, intensify, or become painful, it is best to be seen by your caregiver.  SIGNS OF LABOR   Menstrual-like cramps.  Contractions that are 5 minutes apart or less.  Contractions that start on the top of the uterus and spread down to the lower abdomen and back.  A sense of increased pelvic pressure or back  pain.  A watery or bloody mucus discharge that comes from the vagina. If you have any of these signs before the 37th week of pregnancy, call your caregiver right away. You need to go to the hospital to get checked immediately. HOME CARE INSTRUCTIONS   Avoid all smoking, herbs, alcohol, and unprescribed drugs. These chemicals affect the formation and growth of the baby.  Follow your caregiver's instructions regarding medicine use. There are medicines that are either safe or unsafe to take during pregnancy.  Exercise only as directed by your caregiver. Experiencing uterine cramps is a good sign to stop exercising.  Continue to eat regular, healthy meals.  Wear a good support bra for breast tenderness.  Do not use hot tubs, steam rooms, or saunas.  Wear your seat belt at all times when driving.  Avoid raw meat, uncooked cheese, cat litter boxes, and soil used by cats. These carry germs that can cause birth defects in the baby.  Take your prenatal vitamins.  Try taking a stool softener (if your caregiver approves) if you develop constipation. Eat more high-fiber foods, such as fresh vegetables or fruit and whole grains. Drink plenty of fluids to keep your urine clear or pale yellow.  Take warm sitz baths to soothe any pain or discomfort caused by hemorrhoids. Use hemorrhoid cream if your caregiver approves.  If you develop varicose veins, wear support hose. Elevate your feet for 15 minutes, 3-4 times a day. Limit salt in your diet.  Avoid heavy lifting, wear low heal shoes, and practice good posture.  Rest a lot with your legs elevated if you have leg cramps or low back pain.  Visit your dentist if you have not gone during your pregnancy. Use a soft toothbrush to brush your teeth and be gentle when you floss.  A sexual relationship may be continued unless your caregiver directs you otherwise.  Do not travel far distances unless it is absolutely necessary and only with the approval  of your caregiver.  Take prenatal classes to understand, practice, and ask questions about the labor and delivery.  Make a trial run to the hospital.  Pack your hospital bag.  Prepare the baby's nursery.  Continue to go to all your prenatal visits as directed by your caregiver. SEEK MEDICAL CARE IF:  You are unsure if you are in labor or if your water has broken.  You have dizziness.  You have mild pelvic cramps, pelvic pressure, or nagging pain in your abdominal area.  You have persistent nausea, vomiting, or diarrhea.  You have a bad smelling vaginal discharge.  You have pain with urination. SEEK IMMEDIATE MEDICAL CARE IF:   You have a fever.  You are leaking fluid from your vagina.  You have spotting or bleeding from your vagina.  You have severe abdominal cramping or pain.  You have rapid weight loss or gain.  You have shortness of breath with chest pain.  You notice sudden or extreme swelling   of your face, hands, ankles, feet, or legs.  You have not felt your baby move in over an hour.  You have severe headaches that do not go away with medicine.  You have vision changes. Document Released: 12/02/2001 Document Revised: 12/13/2013 Document Reviewed: 02/08/2013 ExitCare Patient Information 2015 [employer]. This information is not intended to replace advice given to you by your health care provider. Make sure you discuss any questions you have with your health care provider.  

## 2015-08-29 ENCOUNTER — Encounter: Payer: Self-pay | Admitting: Family Medicine

## 2015-08-29 ENCOUNTER — Encounter (HOSPITAL_COMMUNITY): Payer: Self-pay | Admitting: *Deleted

## 2015-08-29 DIAGNOSIS — O99820 Streptococcus B carrier state complicating pregnancy: Secondary | ICD-10-CM | POA: Insufficient documentation

## 2015-08-29 LAB — CULTURE, BETA STREP (GROUP B ONLY)

## 2015-08-29 LAB — GC/CHLAMYDIA PROBE AMP (~~LOC~~) NOT AT ARMC
Chlamydia: NEGATIVE
NEISSERIA GONORRHEA: NEGATIVE

## 2015-08-31 ENCOUNTER — Other Ambulatory Visit (HOSPITAL_COMMUNITY): Payer: BC Managed Care – PPO

## 2015-08-31 ENCOUNTER — Encounter (HOSPITAL_COMMUNITY): Payer: Self-pay

## 2015-08-31 ENCOUNTER — Ambulatory Visit (HOSPITAL_COMMUNITY)
Admission: RE | Admit: 2015-08-31 | Discharge: 2015-08-31 | Disposition: A | Discharge status: Home / Self Care | Payer: BC Managed Care – PPO | Source: Ambulatory Visit | Attending: Physician Assistant | Admitting: Physician Assistant

## 2015-08-31 VITALS — BP 105/62 | HR 90 | Wt 306.0 lb

## 2015-08-31 DIAGNOSIS — E119 Type 2 diabetes mellitus without complications: Secondary | ICD-10-CM | POA: Insufficient documentation

## 2015-08-31 DIAGNOSIS — O24319 Unspecified pre-existing diabetes mellitus in pregnancy, unspecified trimester: Secondary | ICD-10-CM | POA: Insufficient documentation

## 2015-08-31 DIAGNOSIS — O403XX1 Polyhydramnios, third trimester, fetus 1: Secondary | ICD-10-CM

## 2015-08-31 DIAGNOSIS — O99213 Obesity complicating pregnancy, third trimester: Secondary | ICD-10-CM | POA: Insufficient documentation

## 2015-08-31 DIAGNOSIS — O34219 Maternal care for unspecified type scar from previous cesarean delivery: Secondary | ICD-10-CM

## 2015-08-31 DIAGNOSIS — Z3A36 36 weeks gestation of pregnancy: Secondary | ICD-10-CM | POA: Diagnosis not present

## 2015-08-31 DIAGNOSIS — O3421 Maternal care for scar from previous cesarean delivery: Secondary | ICD-10-CM | POA: Insufficient documentation

## 2015-09-01 ENCOUNTER — Other Ambulatory Visit: Payer: Self-pay | Admitting: Obstetrics

## 2015-09-04 ENCOUNTER — Encounter: Payer: BC Managed Care – PPO | Admitting: Advanced Practice Midwife

## 2015-09-04 ENCOUNTER — Other Ambulatory Visit (HOSPITAL_COMMUNITY): Payer: BC Managed Care – PPO

## 2015-09-05 ENCOUNTER — Ambulatory Visit (INDEPENDENT_AMBULATORY_CARE_PROVIDER_SITE_OTHER): Payer: BC Managed Care – PPO | Admitting: Family Medicine

## 2015-09-05 VITALS — BP 120/68 | HR 89 | Temp 98.8°F | Wt 300.7 lb

## 2015-09-05 DIAGNOSIS — O24113 Pre-existing diabetes mellitus, type 2, in pregnancy, third trimester: Secondary | ICD-10-CM

## 2015-09-05 DIAGNOSIS — O09293 Supervision of pregnancy with other poor reproductive or obstetric history, third trimester: Secondary | ICD-10-CM

## 2015-09-05 DIAGNOSIS — O24112 Pre-existing diabetes mellitus, type 2, in pregnancy, second trimester: Secondary | ICD-10-CM | POA: Diagnosis not present

## 2015-09-05 DIAGNOSIS — O09623 Supervision of young multigravida, third trimester: Secondary | ICD-10-CM

## 2015-09-05 LAB — POCT URINALYSIS DIP (DEVICE)
GLUCOSE, UA: 100 mg/dL — AB
HGB URINE DIPSTICK: NEGATIVE
Ketones, ur: 15 mg/dL — AB
Nitrite: NEGATIVE
PROTEIN: 30 mg/dL — AB
UROBILINOGEN UA: 1 mg/dL (ref 0.0–1.0)
pH: 6 (ref 5.0–8.0)

## 2015-09-05 MED ORDER — METOCLOPRAMIDE HCL 10 MG PO TABS: 10.0000 mg | ORAL_TABLET | Freq: Four times a day (QID) | ORAL | Status: AC

## 2015-09-05 MED ORDER — GLYBURIDE 2.5 MG PO TABS: 5.0000 mg | ORAL_TABLET | Freq: Two times a day (BID) | ORAL | Status: AC

## 2015-09-05 NOTE — Progress Notes (Signed)
Pt reports having severe vomiting and only able to eat once a day; pt not able to sleep  Pt reports having leg cramps Pain/pressure-  Generalized pain

## 2015-09-05 NOTE — Progress Notes (Signed)
Subjective:  April Chan is a 34 y.o. G3P2002 at [redacted]w[redacted]d being seen today for ongoing prenatal care.  Patient reports backache, vomiting.  Contractions: Not present.  Vag. Bleeding: None. Movement: Present. Denies leaking of fluid.   DM2: Patient taking metformin and glyburide.  Reports no hypoglycemic episodes.  Tolerating medication well CBGs pulled from glucometer Fasting: 100-111 2hr PP: 130-160  The following portions of the patient's history were reviewed and updated as appropriate: allergies, current medications, past family history, past medical history, past social history, past surgical history and problem list.   Objective:   Filed Vitals:   09/05/15 1550  BP: 120/68  Pulse: 89  Temp: 98.8 F (37.1 C)  Weight: 300 lb 11.2 oz (136.397 kg)    Fetal Status: Fetal Heart Rate (bpm): 157   Movement: Present     General:  Alert, oriented and cooperative. Patient is in no acute distress.  Skin: Skin is warm and dry. No rash noted.   Cardiovascular: Normal heart rate noted  Respiratory: Normal respiratory effort, no problems with respiration noted  Abdomen: Soft, gravid, appropriate for gestational age. Pain/Pressure: Present     Pelvic: Vag. Bleeding: None     Cervical exam deferred        Extremities: Normal range of motion.  Edema: None  Mental Status: Normal mood and affect. Normal behavior. Normal judgment and thought content.   Urinalysis: Urine Protein: 1+ Urine Glucose: 1+  Assessment and Plan:  Pregnancy: G3P2002 at [redacted]w[redacted]d  1. Supervision of high-risk pregnancy of young multigravida, third trimester FHT normal vomiting  2. Type 2 diabetes mellitus affecting pregnancy in second trimester, antepartum Worsening Increase glyburide to  BID.  Continue metformin. BPP 8/8 - glyBURIDE (DIABETA) 2.5 MG tablet; Take 2 tablets (5 mg total) by mouth 2 (two) times daily with a meal.  Dispense: 60 tablet; Refill: 3  3. History of macrosomia in infant in prior pregnancy,  currently pregnant, third trimester   Term labor symptoms and general obstetric precautions including but not limited to vaginal bleeding, contractions, leaking of fluid and fetal movement were reviewed in detail with the patient. Please refer to After Visit Summary for other counseling recommendations.  No Follow-up on file.   Levie Heritage, DO

## 2015-09-07 ENCOUNTER — Ambulatory Visit (HOSPITAL_COMMUNITY)
Admission: RE | Admit: 2015-09-07 | Discharge: 2015-09-07 | Disposition: A | Discharge status: Home / Self Care | Payer: BC Managed Care – PPO | Source: Ambulatory Visit | Attending: Physician Assistant | Admitting: Physician Assistant

## 2015-09-07 ENCOUNTER — Encounter: Payer: Self-pay | Admitting: *Deleted

## 2015-09-07 ENCOUNTER — Other Ambulatory Visit (HOSPITAL_COMMUNITY): Payer: BC Managed Care – PPO

## 2015-09-07 ENCOUNTER — Encounter (HOSPITAL_COMMUNITY): Payer: Self-pay

## 2015-09-07 VITALS — BP 132/80 | HR 108 | Wt 302.0 lb

## 2015-09-07 DIAGNOSIS — O34219 Maternal care for unspecified type scar from previous cesarean delivery: Secondary | ICD-10-CM

## 2015-09-07 DIAGNOSIS — O0932 Supervision of pregnancy with insufficient antenatal care, second trimester: Secondary | ICD-10-CM

## 2015-09-07 DIAGNOSIS — O99213 Obesity complicating pregnancy, third trimester: Secondary | ICD-10-CM | POA: Diagnosis not present

## 2015-09-07 DIAGNOSIS — O24319 Unspecified pre-existing diabetes mellitus in pregnancy, unspecified trimester: Secondary | ICD-10-CM | POA: Diagnosis not present

## 2015-09-07 DIAGNOSIS — O09293 Supervision of pregnancy with other poor reproductive or obstetric history, third trimester: Secondary | ICD-10-CM

## 2015-09-07 DIAGNOSIS — Z3A37 37 weeks gestation of pregnancy: Secondary | ICD-10-CM

## 2015-09-07 DIAGNOSIS — Z3A35 35 weeks gestation of pregnancy: Secondary | ICD-10-CM | POA: Diagnosis not present

## 2015-09-07 DIAGNOSIS — E119 Type 2 diabetes mellitus without complications: Secondary | ICD-10-CM | POA: Diagnosis not present

## 2015-09-07 DIAGNOSIS — O09623 Supervision of young multigravida, third trimester: Secondary | ICD-10-CM

## 2015-09-07 DIAGNOSIS — O09892 Supervision of other high risk pregnancies, second trimester: Secondary | ICD-10-CM

## 2015-09-07 DIAGNOSIS — O3421 Maternal care for scar from previous cesarean delivery: Secondary | ICD-10-CM | POA: Diagnosis not present

## 2015-09-07 DIAGNOSIS — O99342 Other mental disorders complicating pregnancy, second trimester: Secondary | ICD-10-CM

## 2015-09-07 DIAGNOSIS — F32A Depression, unspecified: Secondary | ICD-10-CM

## 2015-09-07 DIAGNOSIS — O99820 Streptococcus B carrier state complicating pregnancy: Secondary | ICD-10-CM

## 2015-09-07 DIAGNOSIS — F329 Major depressive disorder, single episode, unspecified: Secondary | ICD-10-CM

## 2015-09-11 ENCOUNTER — Other Ambulatory Visit (HOSPITAL_COMMUNITY): Payer: BC Managed Care – PPO

## 2015-09-12 ENCOUNTER — Encounter: Payer: BC Managed Care – PPO | Admitting: Obstetrics and Gynecology

## 2015-09-12 DIAGNOSIS — O403XX0 Polyhydramnios, third trimester, not applicable or unspecified: Secondary | ICD-10-CM | POA: Insufficient documentation

## 2015-09-14 ENCOUNTER — Ambulatory Visit (HOSPITAL_COMMUNITY)
Admission: RE | Admit: 2015-09-14 | Discharge: 2015-09-14 | Disposition: A | Discharge status: Home / Self Care | Payer: BC Managed Care – PPO | Source: Ambulatory Visit | Attending: Physician Assistant | Admitting: Physician Assistant

## 2015-09-14 ENCOUNTER — Other Ambulatory Visit (HOSPITAL_COMMUNITY): Payer: BC Managed Care – PPO

## 2015-09-14 DIAGNOSIS — O24319 Unspecified pre-existing diabetes mellitus in pregnancy, unspecified trimester: Secondary | ICD-10-CM | POA: Diagnosis present

## 2015-09-14 DIAGNOSIS — O3421 Maternal care for scar from previous cesarean delivery: Secondary | ICD-10-CM | POA: Diagnosis not present

## 2015-09-14 DIAGNOSIS — O99213 Obesity complicating pregnancy, third trimester: Secondary | ICD-10-CM | POA: Diagnosis not present

## 2015-09-14 DIAGNOSIS — E119 Type 2 diabetes mellitus without complications: Secondary | ICD-10-CM | POA: Insufficient documentation

## 2015-09-14 DIAGNOSIS — Z3A38 38 weeks gestation of pregnancy: Secondary | ICD-10-CM

## 2015-09-14 DIAGNOSIS — O34219 Maternal care for unspecified type scar from previous cesarean delivery: Secondary | ICD-10-CM

## 2015-09-18 ENCOUNTER — Other Ambulatory Visit (HOSPITAL_COMMUNITY): Payer: BC Managed Care – PPO

## 2015-09-19 ENCOUNTER — Ambulatory Visit (INDEPENDENT_AMBULATORY_CARE_PROVIDER_SITE_OTHER): Payer: BC Managed Care – PPO | Admitting: Obstetrics and Gynecology

## 2015-09-19 VITALS — BP 110/75 | HR 98 | Temp 97.5°F | Wt 308.9 lb

## 2015-09-19 DIAGNOSIS — O09623 Supervision of young multigravida, third trimester: Secondary | ICD-10-CM

## 2015-09-19 LAB — POCT URINALYSIS DIP (DEVICE)
Glucose, UA: NEGATIVE mg/dL
HGB URINE DIPSTICK: NEGATIVE
NITRITE: NEGATIVE
Protein, ur: NEGATIVE mg/dL
Specific Gravity, Urine: >=1.03 (ref 1.005–1.030)
UROBILINOGEN UA: 1 mg/dL (ref 0.0–1.0)
pH: 5.5 (ref 5.0–8.0)

## 2015-09-19 NOTE — Progress Notes (Signed)
Subjective:  April Chan is a 34 y.o. G3P2002 at [redacted]w[redacted]d being seen today for ongoing prenatal care.  Patient reports decreased fetal movement. Decreased fetal movement today but did feel baby move, says hasn't been paying much attention, has felt baby move since arrival to this clinic. Checking sugar 3-4 times daily but hasn't recorded consistently in log. Thinks that at least half of fastings are less than 95 and 2-hour PPs less than 120.   Contractions: Not present.  Vag. Bleeding: None. Movement: (!) Decreased. Denies leaking of fluid.   The following portions of the patient's history were reviewed and updated as appropriate: allergies, current medications, past family history, past medical history, past social history, past surgical history and problem list.   Objective:   Filed Vitals:   09/19/15 1613  BP: 110/75  Pulse: 98  Temp: 97.5 F (36.4 C)  Weight: 308 lb 14.4 oz (140.116 kg)    Fetal Status:     Movement: (!) Decreased     General:  Alert, oriented and cooperative. Patient is in no acute distress.  Skin: Skin is warm and dry. No rash noted.   Cardiovascular: Normal heart rate noted  Respiratory: Normal respiratory effort, no problems with respiration noted  Abdomen: Soft, gravid, appropriate for gestational age. Pain/Pressure: Present     Pelvic: Vag. Bleeding: None     Cervical exam deferred        Extremities: Normal range of motion.  Edema: Trace  Mental Status: Normal mood and affect. Normal behavior. Normal judgment and thought content.   Urinalysis: Urine Protein: Negative Urine Glucose: Negative  Assessment and Plan:  Pregnancy: G3P2002 at [redacted]w[redacted]d  1. Supervision of high-risk pregnancy of young multigravida, third trimester - c/s scheduled for Friday - bpp 8/8 with normal afi on 9/23  2. Decreased fetal movement - normal kick counts last two days, did feel baby move this morning and now during visit, normal fetal heart rate - instructed to perform kick count  again when gets home, and if abnormal to present to our MAU for NST  3. BDM - inconsistent record keeping, but patient's recollection suggests appropriate glucose control - normal bpp last week - c/s scheduled in 2 days  Term labor symptoms and general obstetric precautions including but not limited to vaginal bleeding, contractions, leaking of fluid and fetal movement were reviewed in detail with the patient. Please refer to After Visit Summary for other counseling recommendations.  No Follow-up on file.   Kathrynn Running, MD

## 2015-09-20 ENCOUNTER — Encounter (HOSPITAL_COMMUNITY)
Admission: RE | Admit: 2015-09-20 | Discharge: 2015-09-20 | Disposition: A | Discharge status: Home / Self Care | Payer: BC Managed Care – PPO | Source: Ambulatory Visit | Attending: Family Medicine | Admitting: Family Medicine

## 2015-09-20 VITALS — BP 119/64 | HR 96 | Temp 97.5°F | Resp 20 | Ht 67.0 in | Wt 310.0 lb

## 2015-09-20 DIAGNOSIS — O09293 Supervision of pregnancy with other poor reproductive or obstetric history, third trimester: Secondary | ICD-10-CM

## 2015-09-20 DIAGNOSIS — O34219 Maternal care for unspecified type scar from previous cesarean delivery: Secondary | ICD-10-CM

## 2015-09-20 DIAGNOSIS — O99342 Other mental disorders complicating pregnancy, second trimester: Secondary | ICD-10-CM

## 2015-09-20 DIAGNOSIS — O99213 Obesity complicating pregnancy, third trimester: Secondary | ICD-10-CM

## 2015-09-20 DIAGNOSIS — O99019 Anemia complicating pregnancy, unspecified trimester: Secondary | ICD-10-CM

## 2015-09-20 DIAGNOSIS — O24112 Pre-existing diabetes mellitus, type 2, in pregnancy, second trimester: Secondary | ICD-10-CM

## 2015-09-20 DIAGNOSIS — O09892 Supervision of other high risk pregnancies, second trimester: Secondary | ICD-10-CM

## 2015-09-20 DIAGNOSIS — F329 Major depressive disorder, single episode, unspecified: Secondary | ICD-10-CM

## 2015-09-20 DIAGNOSIS — O09623 Supervision of young multigravida, third trimester: Secondary | ICD-10-CM

## 2015-09-20 DIAGNOSIS — O403XX1 Polyhydramnios, third trimester, fetus 1: Secondary | ICD-10-CM

## 2015-09-20 DIAGNOSIS — O99820 Streptococcus B carrier state complicating pregnancy: Secondary | ICD-10-CM

## 2015-09-20 DIAGNOSIS — O0932 Supervision of pregnancy with insufficient antenatal care, second trimester: Secondary | ICD-10-CM

## 2015-09-20 LAB — BASIC METABOLIC PANEL
Anion gap: 8 (ref 5–15)
BUN: 8 mg/dL (ref 6–20)
CHLORIDE: 107 mmol/L (ref 101–111)
CO2: 20 mmol/L — AB (ref 22–32)
Calcium: 8.3 mg/dL — ABNORMAL LOW (ref 8.9–10.3)
Creatinine, Ser: 0.44 mg/dL (ref 0.44–1.00)
GFR calc Af Amer: >60 mL/min (ref >60)
GFR calc non Af Amer: >60 mL/min (ref >60)
Glucose, Bld: 139 mg/dL — ABNORMAL HIGH (ref 65–99)
Potassium: 3.9 mmol/L (ref 3.5–5.1)
Sodium: 135 mmol/L (ref 135–145)

## 2015-09-20 LAB — CBC
HCT: 34.3 % — ABNORMAL LOW (ref 36.0–46.0)
Hemoglobin: 11.4 g/dL — ABNORMAL LOW (ref 12.0–15.0)
MCH: 27 pg (ref 26.0–34.0)
MCHC: 33.2 g/dL (ref 30.0–36.0)
MCV: 81.1 fL (ref 78.0–100.0)
PLATELETS: 155 10*3/uL (ref 150–400)
RBC: 4.23 MIL/uL (ref 3.87–5.11)
RDW: 15.4 % (ref 11.5–15.5)
WBC: 10.2 10*3/uL (ref 4.0–10.5)

## 2015-09-20 LAB — TYPE AND SCREEN
ABO/RH(D): O POS
Antibody Screen: NEGATIVE

## 2015-09-20 MED ORDER — DEXTROSE 5 % IV SOLN
3.0000 g | INTRAVENOUS | Status: AC
  Administered 2015-09-21: 3 g via INTRAVENOUS
  Filled 2015-09-20: qty 3000

## 2015-09-20 NOTE — H&P (Signed)
April Chan is an 34 y.o. G3P2002 [redacted]w[redacted]d female.   Chief Complaint: Previous Cesarean Section   HPI: Here for elective repeat C-section.  Pregnancy complicated by morbid obesity and previous C-section and DM.  Past Medical History  Diagnosis Date  . Asthma   . Diabetes mellitus without complication     oral and insulin  . Depression     h/o pp depression  . Anxiety   . GERD (gastroesophageal reflux disease)   . Gestational diabetes     Past Surgical History  Procedure Laterality Date  . Tonsillectomy    . Wisdom tooth extraction Bilateral   . Cesarean section N/A 06/14/2014    Procedure: CESAREAN SECTION;  Surgeon: Brock Bad, MD;  Location: [facility];  Service: Obstetrics;  Laterality: N/A;    Family History  Problem Relation Age of Onset  . Polycystic kidney disease Mother   . Hypertension Father   . Diabetes Father   . Heart disease Maternal Grandfather   . Diabetes Paternal Grandmother   . Hypertension Paternal Grandmother   . Cancer Paternal Grandmother   . Diabetes Paternal Grandfather   . Heart disease Paternal Grandfather    Social History:  reports that she quit smoking about 2 years ago. Her smoking use included Cigarettes. She smoked 0.25 packs per day. She has never used smokeless tobacco. She reports that she does not drink alcohol or use illicit drugs.  Allergies: No Known Allergies  No current facility-administered medications on file prior to encounter.   Current Outpatient Prescriptions on File Prior to Encounter  Medication Sig Dispense Refill  . BAYER CONTOUR TEST test strip TEST BLOOD SUGARS 4 TIMES DAILY 100 each PRN  . metFORMIN (GLUCOPHAGE) 500 MG tablet Take 1 tablet (500 mg total) by mouth 2 (two) times daily with a meal. (Patient taking differently: Take 500 mg by mouth 2 (two) times daily with a meal. Two in the morning and one at night) 60 tablet 5  . omeprazole (PRILOSEC) 20 MG capsule TAKE 1 CAPSULE (20 MG TOTAL) BY MOUTH AT BEDTIME. 30  capsule 11  . Prenatal Vit-Fe Fumarate-FA (VITAFOL-OB) TABS Take 1 tablet by mouth daily before breakfast. 30 each 11    A comprehensive review of systems was negative.  Last menstrual period 12/22/2014, currently breastfeeding. General appearance: alert, cooperative and appears stated age Head: Normocephalic, without obvious abnormality, atraumatic Neck: supple, symmetrical, trachea midline Lungs: normal effort Heart: regular rate and rhythm Abdomen: gravid, NT Extremities: extremities normal, atraumatic, no cyanosis or edema Pulses: 2+ and symmetric Skin: Skin color, texture, turgor normal. No rashes or lesions Lymph nodes: Cervical, supraclavicular, and axillary nodes normal. Neurologic: Grossly normal   Lab Results  Component Value Date   WBC 10.2 09/20/2015   HGB 11.4* 09/20/2015   HCT 34.3* 09/20/2015   MCV 81.1 09/20/2015   PLT 155 09/20/2015         ABO, Rh: --/--/O POS (09/29 1015)  Antibody: NEG (09/29 1015)  Rubella:    RPR: NON REAC (08/22 1448)  HBsAg: Negative (06/13 0000)  HIV: NONREACTIVE (08/22 1448)  GBS:       Prenatal Transfer Tool  Maternal Diabetes: Yes:  Diabetes Type:  Pre-pregnancy, Insulin/Medication controlled Genetic Screening: Declined too late Maternal Ultrasounds/Referrals: Normal Poly, macrosomia Fetal Ultrasounds or other Referrals:  Fetal echo Normal Maternal Substance Abuse:  No Significant Maternal Medications:  Meds include: Other: glucophage Significant Maternal Lab Results: Lab values include: Group B Strep positive    Assessment/Plan Patient  Active Problem List   Diagnosis Date Noted  . Polyhydramnios, third trimester, antepartum condition or complication 09/12/2015  . Group B Streptococcus carrier, +RV culture, currently pregnant 08/29/2015  . Supervision of high-risk pregnancy of young multigravida 08/28/2015  . Macrosomia 08/13/2015  . Short interval between pregnancies affecting pregnancy in second trimester,  antepartum 06/28/2015  . Depression during pregnancy in second trimester 06/28/2015  . Late prenatal care affecting pregnancy in second trimester, antepartum 06/28/2015  . History of macrosomia in infant in prior pregnancy, currently pregnant 06/28/2015  . 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 Previous cesarean delivery affecting pregnancy, antepartum 06/28/2015  . Type 2 diabetes mellitus affecting pregnancy in second trimester, antepartum   . Maternal morbid obesity, antepartum   . Anemia affecting pregnancy 06/15/2014    For RLTCS at 39 wks. Risks include but are not limited to bleeding, infection, injury to surrounding structures, including bowel, bladder and ureters, blood clots, and death.  Likelihood of success is high.   PRATT,TANYA S 09/20/2015, 5:56 PM

## 2015-09-20 NOTE — Patient Instructions (Addendum)
   Your procedure is scheduled on: sept 30 at 9am  Enter through the Main Entrance of [facility] at: 730am  Pick up the phone at the desk and dial [phone] and inform us of your arrival.  Please call this number if you have any problems the morning of surgery: [phone]  Remember: DO NOT EAT OR DRINK AFTER MIDNIGHT SEPT 29  Take these medicines the morning of surgery with a SIP OF WATER: DO NOT TAKE METFORMIN DAY OF SURGERY  Do not wear jewelry,  No metal in your hair or on your body. Do not wear lotions, powders, perfumes.  You may wear deodorant.  Do not bring valuables to the hospital.   Leave suitcase in the car. After Surgery it may be brought to your room. For patients being admitted to the hospital, checkout time is 11:00am the day of discharge.

## 2015-09-21 ENCOUNTER — Encounter (HOSPITAL_COMMUNITY): Payer: Self-pay

## 2015-09-21 ENCOUNTER — Other Ambulatory Visit (HOSPITAL_COMMUNITY): Payer: BC Managed Care – PPO

## 2015-09-21 ENCOUNTER — Inpatient Hospital Stay (HOSPITAL_COMMUNITY)
Admission: RE | Admit: 2015-09-21 | Discharge: 2015-09-24 | DRG: 765 | Disposition: A | Discharge status: Home / Self Care | Payer: BC Managed Care – PPO | Source: Ambulatory Visit | Attending: Family Medicine | Admitting: Family Medicine

## 2015-09-21 ENCOUNTER — Encounter (HOSPITAL_COMMUNITY)
Admission: RE | Disposition: A | Discharge status: Home / Self Care | Payer: Self-pay | Source: Ambulatory Visit | Attending: Family Medicine

## 2015-09-21 ENCOUNTER — Inpatient Hospital Stay (HOSPITAL_COMMUNITY): Payer: BC Managed Care – PPO | Admitting: Anesthesiology

## 2015-09-21 ENCOUNTER — Ambulatory Visit (HOSPITAL_COMMUNITY): Admission: RE | Admit: 2015-09-21 | Payer: BC Managed Care – PPO | Source: Ambulatory Visit

## 2015-09-21 DIAGNOSIS — Z6841 Body Mass Index (BMI) 40.0 and over, adult: Secondary | ICD-10-CM | POA: Diagnosis not present

## 2015-09-21 DIAGNOSIS — O99824 Streptococcus B carrier state complicating childbirth: Secondary | ICD-10-CM | POA: Diagnosis present

## 2015-09-21 DIAGNOSIS — K219 Gastro-esophageal reflux disease without esophagitis: Secondary | ICD-10-CM | POA: Diagnosis present

## 2015-09-21 DIAGNOSIS — O24112 Pre-existing diabetes mellitus, type 2, in pregnancy, second trimester: Secondary | ICD-10-CM | POA: Diagnosis present

## 2015-09-21 DIAGNOSIS — Z87891 Personal history of nicotine dependence: Secondary | ICD-10-CM

## 2015-09-21 DIAGNOSIS — O9952 Diseases of the respiratory system complicating childbirth: Secondary | ICD-10-CM | POA: Diagnosis present

## 2015-09-21 DIAGNOSIS — O9962 Diseases of the digestive system complicating childbirth: Secondary | ICD-10-CM | POA: Diagnosis present

## 2015-09-21 DIAGNOSIS — O9902 Anemia complicating childbirth: Secondary | ICD-10-CM

## 2015-09-21 DIAGNOSIS — O24425 Gestational diabetes mellitus in childbirth, controlled by oral hypoglycemic drugs: Secondary | ICD-10-CM | POA: Diagnosis present

## 2015-09-21 DIAGNOSIS — D649 Anemia, unspecified: Secondary | ICD-10-CM

## 2015-09-21 DIAGNOSIS — F32A Depression, unspecified: Secondary | ICD-10-CM

## 2015-09-21 DIAGNOSIS — B001 Herpesviral vesicular dermatitis: Secondary | ICD-10-CM | POA: Diagnosis present

## 2015-09-21 DIAGNOSIS — F329 Major depressive disorder, single episode, unspecified: Secondary | ICD-10-CM | POA: Diagnosis present

## 2015-09-21 DIAGNOSIS — O3663X0 Maternal care for excessive fetal growth, third trimester, not applicable or unspecified: Secondary | ICD-10-CM | POA: Diagnosis present

## 2015-09-21 DIAGNOSIS — O34219 Maternal care for unspecified type scar from previous cesarean delivery: Secondary | ICD-10-CM | POA: Diagnosis present

## 2015-09-21 DIAGNOSIS — O3421 Maternal care for scar from previous cesarean delivery: Secondary | ICD-10-CM

## 2015-09-21 DIAGNOSIS — O99210 Obesity complicating pregnancy, unspecified trimester: Secondary | ICD-10-CM

## 2015-09-21 DIAGNOSIS — O99019 Anemia complicating pregnancy, unspecified trimester: Secondary | ICD-10-CM

## 2015-09-21 DIAGNOSIS — Z3202 Encounter for pregnancy test, result negative: Secondary | ICD-10-CM | POA: Diagnosis not present

## 2015-09-21 DIAGNOSIS — O0930 Supervision of pregnancy with insufficient antenatal care, unspecified trimester: Secondary | ICD-10-CM

## 2015-09-21 DIAGNOSIS — J45909 Unspecified asthma, uncomplicated: Secondary | ICD-10-CM | POA: Diagnosis present

## 2015-09-21 DIAGNOSIS — Z3A39 39 weeks gestation of pregnancy: Secondary | ICD-10-CM

## 2015-09-21 DIAGNOSIS — O99214 Obesity complicating childbirth: Secondary | ICD-10-CM | POA: Diagnosis present

## 2015-09-21 DIAGNOSIS — O99213 Obesity complicating pregnancy, third trimester: Secondary | ICD-10-CM

## 2015-09-21 DIAGNOSIS — O2412 Pre-existing diabetes mellitus, type 2, in childbirth: Secondary | ICD-10-CM

## 2015-09-21 DIAGNOSIS — Z3490 Encounter for supervision of normal pregnancy, unspecified, unspecified trimester: Secondary | ICD-10-CM

## 2015-09-21 DIAGNOSIS — F419 Anxiety disorder, unspecified: Secondary | ICD-10-CM | POA: Diagnosis present

## 2015-09-21 DIAGNOSIS — Z302 Encounter for sterilization: Secondary | ICD-10-CM | POA: Diagnosis not present

## 2015-09-21 DIAGNOSIS — O403XX0 Polyhydramnios, third trimester, not applicable or unspecified: Secondary | ICD-10-CM | POA: Diagnosis present

## 2015-09-21 DIAGNOSIS — O0932 Supervision of pregnancy with insufficient antenatal care, second trimester: Secondary | ICD-10-CM

## 2015-09-21 DIAGNOSIS — O99344 Other mental disorders complicating childbirth: Secondary | ICD-10-CM | POA: Diagnosis not present

## 2015-09-21 DIAGNOSIS — O99342 Other mental disorders complicating pregnancy, second trimester: Secondary | ICD-10-CM

## 2015-09-21 DIAGNOSIS — O09293 Supervision of pregnancy with other poor reproductive or obstetric history, third trimester: Secondary | ICD-10-CM

## 2015-09-21 DIAGNOSIS — O09623 Supervision of young multigravida, third trimester: Secondary | ICD-10-CM

## 2015-09-21 DIAGNOSIS — O09892 Supervision of other high risk pregnancies, second trimester: Secondary | ICD-10-CM

## 2015-09-21 DIAGNOSIS — O99820 Streptococcus B carrier state complicating pregnancy: Secondary | ICD-10-CM

## 2015-09-21 DIAGNOSIS — Z7984 Long term (current) use of oral hypoglycemic drugs: Secondary | ICD-10-CM

## 2015-09-21 DIAGNOSIS — O403XX1 Polyhydramnios, third trimester, fetus 1: Secondary | ICD-10-CM

## 2015-09-21 DIAGNOSIS — E119 Type 2 diabetes mellitus without complications: Secondary | ICD-10-CM

## 2015-09-21 LAB — GLUCOSE, CAPILLARY
GLUCOSE-CAPILLARY: 94 mg/dL (ref 65–99)
GLUCOSE-CAPILLARY: 127 mg/dL — AB (ref 65–99)
GLUCOSE-CAPILLARY: 85 mg/dL (ref 65–99)

## 2015-09-21 LAB — CBC
HEMATOCRIT: 29.5 % — AB (ref 36.0–46.0)
HEMOGLOBIN: 9.7 g/dL — AB (ref 12.0–15.0)
MCH: 27.1 pg (ref 26.0–34.0)
MCHC: 32.9 g/dL (ref 30.0–36.0)
MCV: 82.4 fL (ref 78.0–100.0)
Platelets: 144 10*3/uL — ABNORMAL LOW (ref 150–400)
RBC: 3.58 MIL/uL — ABNORMAL LOW (ref 3.87–5.11)
RDW: 15.5 % (ref 11.5–15.5)
WBC: 11.4 10*3/uL — ABNORMAL HIGH (ref 4.0–10.5)

## 2015-09-21 LAB — RPR: RPR: NONREACTIVE

## 2015-09-21 SURGERY — Surgical Case
Anesthesia: Spinal | Site: Abdomen

## 2015-09-21 MED ORDER — HYDROMORPHONE HCL 1 MG/ML IJ SOLN
INTRAMUSCULAR | Status: AC
  Administered 2015-09-21: 0.5 mg via INTRAVENOUS
  Filled 2015-09-21: qty 1

## 2015-09-21 MED ORDER — LACTATED RINGERS IV SOLN: INTRAVENOUS | Status: DC

## 2015-09-21 MED ORDER — LACTATED RINGERS IV SOLN
INTRAVENOUS | Status: DC | PRN
  Administered 2015-09-21: 10:00:00 via INTRAVENOUS

## 2015-09-21 MED ORDER — OXYTOCIN 40 UNITS IN LACTATED RINGERS INFUSION - SIMPLE MED: 62.5000 mL/h | INTRAVENOUS | Status: AC

## 2015-09-21 MED ORDER — KETOROLAC TROMETHAMINE 30 MG/ML IJ SOLN: 30.0000 mg | Freq: Once | INTRAMUSCULAR | Status: DC | PRN

## 2015-09-21 MED ORDER — PHENYLEPHRINE 8 MG IN D5W 100 ML (0.08MG/ML) PREMIX OPTIME
INJECTION | INTRAVENOUS | Status: DC | PRN
  Administered 2015-09-21: 60 ug/min via INTRAVENOUS

## 2015-09-21 MED ORDER — ACETAMINOPHEN 500 MG PO TABS
1000.0000 mg | ORAL_TABLET | Freq: Four times a day (QID) | ORAL | Status: AC
  Administered 2015-09-21: 1000 mg via ORAL
  Filled 2015-09-21: qty 2

## 2015-09-21 MED ORDER — ONDANSETRON HCL 4 MG/2ML IJ SOLN: 4.0000 mg | Freq: Three times a day (TID) | INTRAMUSCULAR | Status: DC | PRN

## 2015-09-21 MED ORDER — SCOPOLAMINE 1 MG/3DAYS TD PT72: 1.0000 | MEDICATED_PATCH | Freq: Once | TRANSDERMAL | Status: DC

## 2015-09-21 MED ORDER — PRENATAL MULTIVITAMIN CH
1.0000 | ORAL_TABLET | Freq: Every day | ORAL | Status: DC
  Administered 2015-09-22 – 2015-09-24 (×3): 1 via ORAL
  Filled 2015-09-21 (×3): qty 1

## 2015-09-21 MED ORDER — EPHEDRINE 5 MG/ML INJ
INTRAVENOUS | Status: AC
  Filled 2015-09-21: qty 10

## 2015-09-21 MED ORDER — MENTHOL 3 MG MT LOZG: 1.0000 | LOZENGE | OROMUCOSAL | Status: DC | PRN

## 2015-09-21 MED ORDER — FENTANYL CITRATE (PF) 100 MCG/2ML IJ SOLN
INTRAMUSCULAR | Status: DC | PRN
  Administered 2015-09-21: 100 ug via INTRAVENOUS
  Administered 2015-09-21: 10 ug via INTRATHECAL

## 2015-09-21 MED ORDER — BUPIVACAINE HCL (PF) 0.25 % IJ SOLN
INTRAMUSCULAR | Status: DC | PRN
  Administered 2015-09-21: 30 mL

## 2015-09-21 MED ORDER — OXYTOCIN 10 UNIT/ML IJ SOLN
40.0000 [IU] | INTRAVENOUS | Status: DC | PRN
  Administered 2015-09-21: 40 [IU] via INTRAVENOUS

## 2015-09-21 MED ORDER — MORPHINE SULFATE (PF) 0.5 MG/ML IJ SOLN
INTRAMUSCULAR | Status: AC
  Filled 2015-09-21: qty 100

## 2015-09-21 MED ORDER — BUPIVACAINE IN DEXTROSE 0.75-8.25 % IT SOLN
INTRATHECAL | Status: DC | PRN
  Administered 2015-09-21: 1.6 mL via INTRATHECAL

## 2015-09-21 MED ORDER — NALBUPHINE HCL 10 MG/ML IJ SOLN: 5.0000 mg | Freq: Once | INTRAMUSCULAR | Status: DC | PRN

## 2015-09-21 MED ORDER — METFORMIN HCL 500 MG PO TABS
500.0000 mg | ORAL_TABLET | Freq: Two times a day (BID) | ORAL | Status: DC
  Administered 2015-09-21 – 2015-09-24 (×6): 500 mg via ORAL
  Filled 2015-09-21 (×8): qty 1

## 2015-09-21 MED ORDER — DIPHENHYDRAMINE HCL 50 MG/ML IJ SOLN: 12.5000 mg | INTRAMUSCULAR | Status: DC | PRN

## 2015-09-21 MED ORDER — NALOXONE HCL 1 MG/ML IJ SOLN
1.0000 ug/kg/h | INTRAVENOUS | Status: DC | PRN
  Filled 2015-09-21: qty 2

## 2015-09-21 MED ORDER — ZOLPIDEM TARTRATE 5 MG PO TABS: 5.0000 mg | ORAL_TABLET | Freq: Every evening | ORAL | Status: DC | PRN

## 2015-09-21 MED ORDER — WITCH HAZEL-GLYCERIN EX PADS: 1.0000 "application " | MEDICATED_PAD | CUTANEOUS | Status: DC | PRN

## 2015-09-21 MED ORDER — LACTATED RINGERS IV SOLN
INTRAVENOUS | Status: DC
  Administered 2015-09-21 (×4): via INTRAVENOUS

## 2015-09-21 MED ORDER — NALOXONE HCL 0.4 MG/ML IJ SOLN: 0.4000 mg | INTRAMUSCULAR | Status: DC | PRN

## 2015-09-21 MED ORDER — KETOROLAC TROMETHAMINE 30 MG/ML IJ SOLN: 30.0000 mg | Freq: Four times a day (QID) | INTRAMUSCULAR | Status: AC | PRN

## 2015-09-21 MED ORDER — ONDANSETRON HCL 4 MG/2ML IJ SOLN
INTRAMUSCULAR | Status: AC
  Filled 2015-09-21: qty 2

## 2015-09-21 MED ORDER — PHENYLEPHRINE 8 MG IN D5W 100 ML (0.08MG/ML) PREMIX OPTIME
10.0000 ug/min | INJECTION | Freq: Once | INTRAVENOUS | Status: DC
  Filled 2015-09-21: qty 100

## 2015-09-21 MED ORDER — LANOLIN HYDROUS EX OINT: 1.0000 "application " | TOPICAL_OINTMENT | CUTANEOUS | Status: DC | PRN

## 2015-09-21 MED ORDER — FENTANYL CITRATE (PF) 100 MCG/2ML IJ SOLN
INTRAMUSCULAR | Status: AC
  Filled 2015-09-21: qty 2

## 2015-09-21 MED ORDER — PHENYLEPHRINE 8 MG IN D5W 100 ML (0.08MG/ML) PREMIX OPTIME
INJECTION | INTRAVENOUS | Status: AC
  Filled 2015-09-21: qty 100

## 2015-09-21 MED ORDER — DIPHENHYDRAMINE HCL 50 MG/ML IJ SOLN
INTRAMUSCULAR | Status: AC
  Filled 2015-09-21: qty 1

## 2015-09-21 MED ORDER — SENNOSIDES-DOCUSATE SODIUM 8.6-50 MG PO TABS
2.0000 | ORAL_TABLET | ORAL | Status: DC
  Administered 2015-09-22 (×2): 2 via ORAL
  Filled 2015-09-21 (×3): qty 2

## 2015-09-21 MED ORDER — OXYCODONE-ACETAMINOPHEN 5-325 MG PO TABS
1.0000 | ORAL_TABLET | ORAL | Status: DC | PRN
  Administered 2015-09-21 – 2015-09-22 (×2): 1 via ORAL
  Filled 2015-09-21 (×2): qty 1

## 2015-09-21 MED ORDER — NALBUPHINE HCL 10 MG/ML IJ SOLN: 5.0000 mg | INTRAMUSCULAR | Status: DC | PRN

## 2015-09-21 MED ORDER — MICROFIBRILLAR COLL HEMOSTAT EX POWD
CUTANEOUS | Status: AC
  Filled 2015-09-21: qty 5

## 2015-09-21 MED ORDER — EPHEDRINE SULFATE 50 MG/ML IJ SOLN
INTRAMUSCULAR | Status: DC | PRN
  Administered 2015-09-21: 10 mg via INTRAVENOUS

## 2015-09-21 MED ORDER — PROMETHAZINE HCL 25 MG/ML IJ SOLN: 6.2500 mg | INTRAMUSCULAR | Status: DC | PRN

## 2015-09-21 MED ORDER — MEPERIDINE HCL 25 MG/ML IJ SOLN
6.2500 mg | INTRAMUSCULAR | Status: DC | PRN
Start: 2015-09-21 — End: 2015-09-21

## 2015-09-21 MED ORDER — TETANUS-DIPHTH-ACELL PERTUSSIS 5-2.5-18.5 LF-MCG/0.5 IM SUSP: 0.5000 mL | Freq: Once | INTRAMUSCULAR | Status: DC

## 2015-09-21 MED ORDER — DEXAMETHASONE SODIUM PHOSPHATE 4 MG/ML IJ SOLN
INTRAMUSCULAR | Status: DC | PRN
  Administered 2015-09-21: 4 mg via INTRAVENOUS

## 2015-09-21 MED ORDER — DIPHENHYDRAMINE HCL 25 MG PO CAPS: 25.0000 mg | ORAL_CAPSULE | Freq: Four times a day (QID) | ORAL | Status: DC | PRN

## 2015-09-21 MED ORDER — MORPHINE SULFATE (PF) 0.5 MG/ML IJ SOLN
INTRAMUSCULAR | Status: DC | PRN
  Administered 2015-09-21: .2 mg via INTRATHECAL

## 2015-09-21 MED ORDER — DIPHENHYDRAMINE HCL 50 MG/ML IJ SOLN: INTRAMUSCULAR | Status: DC | PRN

## 2015-09-21 MED ORDER — IBUPROFEN 600 MG PO TABS
600.0000 mg | ORAL_TABLET | Freq: Four times a day (QID) | ORAL | Status: DC
  Administered 2015-09-21 – 2015-09-24 (×11): 600 mg via ORAL
  Filled 2015-09-21 (×12): qty 1

## 2015-09-21 MED ORDER — DIPHENHYDRAMINE HCL 25 MG PO CAPS
25.0000 mg | ORAL_CAPSULE | ORAL | Status: DC | PRN
  Administered 2015-09-21: 25 mg via ORAL
  Filled 2015-09-21: qty 1

## 2015-09-21 MED ORDER — FENTANYL CITRATE (PF) 100 MCG/2ML IJ SOLN
INTRAMUSCULAR | Status: AC
  Filled 2015-09-21: qty 4

## 2015-09-21 MED ORDER — LACTATED RINGERS IV SOLN
Freq: Once | INTRAVENOUS | Status: AC
  Administered 2015-09-21: 08:00:00 via INTRAVENOUS

## 2015-09-21 MED ORDER — SIMETHICONE 80 MG PO CHEW
80.0000 mg | CHEWABLE_TABLET | ORAL | Status: DC | PRN
  Administered 2015-09-22: 80 mg via ORAL

## 2015-09-21 MED ORDER — ONDANSETRON HCL 4 MG/2ML IJ SOLN
INTRAMUSCULAR | Status: DC | PRN
  Administered 2015-09-21: 4 mg via INTRAVENOUS

## 2015-09-21 MED ORDER — IBUPROFEN 600 MG PO TABS
600.0000 mg | ORAL_TABLET | Freq: Four times a day (QID) | ORAL | Status: DC | PRN
  Administered 2015-09-23: 600 mg via ORAL

## 2015-09-21 MED ORDER — SIMETHICONE 80 MG PO CHEW
80.0000 mg | CHEWABLE_TABLET | ORAL | Status: DC
  Administered 2015-09-22 – 2015-09-24 (×3): 80 mg via ORAL
  Filled 2015-09-21 (×3): qty 1

## 2015-09-21 MED ORDER — SODIUM CHLORIDE 0.9 % IJ SOLN: 3.0000 mL | INTRAMUSCULAR | Status: DC | PRN

## 2015-09-21 MED ORDER — SCOPOLAMINE 1 MG/3DAYS TD PT72
MEDICATED_PATCH | TRANSDERMAL | Status: AC
  Administered 2015-09-21: 1.5 mg via TRANSDERMAL
  Filled 2015-09-21: qty 1

## 2015-09-21 MED ORDER — MEPERIDINE HCL 25 MG/ML IJ SOLN
INTRAMUSCULAR | Status: AC
  Filled 2015-09-21: qty 1

## 2015-09-21 MED ORDER — SCOPOLAMINE 1 MG/3DAYS TD PT72
1.0000 | MEDICATED_PATCH | Freq: Once | TRANSDERMAL | Status: DC
  Administered 2015-09-21: 1.5 mg via TRANSDERMAL

## 2015-09-21 MED ORDER — OXYTOCIN 10 UNIT/ML IJ SOLN
INTRAMUSCULAR | Status: AC
  Filled 2015-09-21: qty 4

## 2015-09-21 MED ORDER — OXYCODONE-ACETAMINOPHEN 5-325 MG PO TABS
2.0000 | ORAL_TABLET | ORAL | Status: DC | PRN
  Administered 2015-09-22 – 2015-09-24 (×9): 2 via ORAL
  Filled 2015-09-21 (×10): qty 2

## 2015-09-21 MED ORDER — DIBUCAINE 1 % RE OINT: 1.0000 "application " | TOPICAL_OINTMENT | RECTAL | Status: DC | PRN

## 2015-09-21 MED ORDER — ACETAMINOPHEN 325 MG PO TABS: 650.0000 mg | ORAL_TABLET | ORAL | Status: DC | PRN

## 2015-09-21 MED ORDER — HYDROMORPHONE HCL 1 MG/ML IJ SOLN
0.2500 mg | INTRAMUSCULAR | Status: DC | PRN
  Administered 2015-09-21: 0.5 mg via INTRAVENOUS

## 2015-09-21 MED ORDER — SIMETHICONE 80 MG PO CHEW
80.0000 mg | CHEWABLE_TABLET | Freq: Three times a day (TID) | ORAL | Status: DC
  Administered 2015-09-21 – 2015-09-24 (×8): 80 mg via ORAL
  Filled 2015-09-21 (×8): qty 1

## 2015-09-21 MED ORDER — MEPERIDINE HCL 25 MG/ML IJ SOLN: 6.2500 mg | INTRAMUSCULAR | Status: DC | PRN

## 2015-09-21 MED ORDER — BUPIVACAINE HCL (PF) 0.25 % IJ SOLN
INTRAMUSCULAR | Status: AC
  Filled 2015-09-21: qty 30

## 2015-09-21 SURGICAL SUPPLY — 33 items
CLAMP CORD UMBIL (MISCELLANEOUS) IMPLANT
CLIP FILSHIE TUBAL LIGA STRL (Clip) ×4 IMPLANT
CLOTH BEACON ORANGE TIMEOUT ST (SAFETY) ×3 IMPLANT
DRAPE SHEET LG 3/4 BI-LAMINATE (DRAPES) ×2 IMPLANT
DRESSING DISP NPWT PICO 4X12 (MISCELLANEOUS) ×2 IMPLANT
DRSG OPSITE 11X17.75 LRG (GAUZE/BANDAGES/DRESSINGS) ×2 IMPLANT
DRSG OPSITE POSTOP 4X10 (GAUZE/BANDAGES/DRESSINGS) ×3 IMPLANT
DURAPREP 26ML APPLICATOR (WOUND CARE) ×3 IMPLANT
ELECT REM PT RETURN 9FT ADLT (ELECTROSURGICAL) ×3
ELECTRODE REM PT RTRN 9FT ADLT (ELECTROSURGICAL) ×1 IMPLANT
EXTRACTOR VACUUM M CUP 4 TUBE (SUCTIONS) IMPLANT
EXTRACTOR VACUUM M CUP 4' TUBE (SUCTIONS)
GLOVE BIOGEL PI IND STRL 7.0 (GLOVE) ×1 IMPLANT
GLOVE BIOGEL PI INDICATOR 7.0 (GLOVE) ×2
GLOVE ECLIPSE 7.0 STRL STRAW (GLOVE) ×6 IMPLANT
GOWN STRL REUS W/TWL LRG LVL3 (GOWN DISPOSABLE) ×6 IMPLANT
KIT ABG SYR 3ML LUER SLIP (SYRINGE) IMPLANT
NDL HYPO 25X5/8 SAFETYGLIDE (NEEDLE) IMPLANT
NEEDLE HYPO 22GX1.5 SAFETY (NEEDLE) ×3 IMPLANT
NEEDLE HYPO 25X5/8 SAFETYGLIDE (NEEDLE) IMPLANT
NS IRRIG 1000ML POUR BTL (IV SOLUTION) ×3 IMPLANT
PACK C SECTION WH (CUSTOM PROCEDURE TRAY) ×3 IMPLANT
PAD OB MATERNITY 4.3X12.25 (PERSONAL CARE ITEMS) ×3 IMPLANT
PENCIL SMOKE EVAC W/HOLSTER (ELECTROSURGICAL) ×3 IMPLANT
RETRACTOR TRAXI PANNICULUS (MISCELLANEOUS) IMPLANT
RTRCTR C-SECT PINK 25CM LRG (MISCELLANEOUS) ×3 IMPLANT
SUT VIC AB 0 CTX 36 (SUTURE) ×9
SUT VIC AB 0 CTX36XBRD ANBCTRL (SUTURE) ×3 IMPLANT
SUT VIC AB 4-0 KS 27 (SUTURE) IMPLANT
SYR 30ML LL (SYRINGE) ×3 IMPLANT
TOWEL OR 17X24 6PK STRL BLUE (TOWEL DISPOSABLE) ×3 IMPLANT
TRAXI PANNICULUS RETRACTOR (MISCELLANEOUS) ×2
TRAY FOLEY CATH SILVER 14FR (SET/KITS/TRAYS/PACK) ×3 IMPLANT

## 2015-09-21 NOTE — Anesthesia Procedure Notes (Signed)
Spinal Patient location during procedure: OR Start time: 09/21/2015 9:04 AM End time: 09/21/2015 9:11 AM Staffing Anesthesiologist: Leilani Able Performed by: anesthesiologist  Preanesthetic Checklist Completed: patient identified, surgical consent, pre-op evaluation, timeout performed, IV checked, risks and benefits discussed and monitors and equipment checked Spinal Block Patient position: sitting Prep: site prepped and draped and DuraPrep Patient monitoring: heart rate, cardiac monitor, continuous pulse ox and blood pressure Approach: midline Location: L3-4 Injection technique: single-shot Needle Needle type: Pencan  Needle gauge: 24 G Needle length: 9 cm Needle insertion depth: 8 cm Assessment Sensory level: T4

## 2015-09-21 NOTE — Interval H&P Note (Signed)
History and Physical Interval Note:  09/21/2015 8:16 AM  April Chan  has presented today for surgery, with the diagnosis of REPEAT c-section  The various methods of treatment have been discussed with the patient and family. After consideration of risks, benefits and other options for treatment, the patient has consented to  Procedure(s): CESAREAN SECTION (N/A) as a surgical intervention .  The patient's history has been reviewed, patient examined, no change in status, stable for surgery.  I have reviewed the patient's chart and labs.  Questions were answered to the patient's satisfaction.     PRATT,TANYA S

## 2015-09-21 NOTE — Lactation Note (Signed)
This note was copied from the chart of April Chan. Lactation Consultation Note  Patient Name: April Chan [id number] Date: 09/21/2015 Reason for consult: Follow-up assessment;NICU baby Baby hypoglycemic with blood sugars less than 40, has had 2 does glucose gel. At this visit Mom latched baby in cradle hold, assisted Mom to obtain more depth. Baby sleepy at the breast but few good suckling bursts, no swallows noted. Mom has history of LMS with 2 older children and prefers to supplement due to history and baby having low sugars. Mom has tubular shaped breasts with not much glandular tissue palpable. About 3 FB spacing between breasts and denies breast changes early pregnancy.  PLan discussed was for Mom to BF with each feeding 15-30 minutes, FOB to give supplement of EBM/formula via bottle (parents choice) while Mom post pumps. Lactation brochure left for review, advised of OP services and encouraged OP f/u for pre/post weight check.  F/U at 2055: Repeat blood sugar had not improved so baby going to NICU. RN to set up DEBP for Mom to start pumping every 3 hours for 15 minutes on Preemie setting to encourage milk production. Reviewed storage guidelines with Mom for NICU, NICU booklet given for review. Hand expressed approx 1.5 ml of colostrum to give to baby.   Maternal Data Has patient been taught Hand Expression?: Yes Does the patient have breastfeeding experience prior to this delivery?: Yes  Feeding Feeding Type: Breast Fed Length of feed: 20 min  LATCH Score/Interventions Latch: Repeated attempts needed to sustain latch, nipple held in mouth throughout feeding, stimulation needed to elicit sucking reflex. Intervention(s): Adjust position;Assist with latch  Audible Swallowing: None  Type of Nipple: Everted at rest and after stimulation  Comfort (Breast/Nipple): Soft / non-tender     Hold (Positioning): Assistance needed to correctly position infant at breast and maintain  latch. Intervention(s): Breastfeeding basics reviewed;Support Pillows;Skin to skin  LATCH Score: 6  Lactation Tools Discussed/Used Tools: Pump Breast pump type: Double-Electric Breast Pump WIC Program: Yes   Consult Status Consult Status: Follow-up Date: 09/22/15 Follow-up type: In-patient    Alfred Levins 09/21/2015, 9:06 PM

## 2015-09-21 NOTE — Transfer of Care (Signed)
Immediate Anesthesia Transfer of Care Note  Patient: April Chan  Procedure(s) Performed: Procedure(s): CESAREAN SECTION (N/A)  Patient Location: PACU  Anesthesia Type:Spinal  Level of Consciousness: awake, alert , oriented and patient cooperative  Airway & Oxygen Therapy: Patient Spontanous Breathing  Post-op Assessment: Report given to RN and Post -op Vital signs reviewed and stable  Post vital signs: Reviewed and stable  Last Vitals:  Filed Vitals:   09/21/15 0744  BP: 131/85  Pulse: 77  Temp: 36.5 C  Resp: 22    Complications: No apparent anesthesia complications

## 2015-09-21 NOTE — Anesthesia Preprocedure Evaluation (Signed)
Anesthesia Evaluation  Patient identified by MRN, date of birth, ID band Patient awake    Reviewed: Allergy & Precautions, H&P , NPO status , Patient's Chart, lab work & pertinent test results  Airway Mallampati: I  TM Distance: >3 FB Neck ROM: full    Dental no notable dental hx.    Pulmonary former smoker,    Pulmonary exam normal        Cardiovascular negative cardio ROS Normal cardiovascular exam     Neuro/Psych negative neurological ROS     GI/Hepatic negative GI ROS, Neg liver ROS,   Endo/Other  diabetes, Gestational, Oral Hypoglycemic Agents  Renal/GU negative Renal ROS     Musculoskeletal   Abdominal (+) + obese,   Peds  Hematology   Anesthesia Other Findings   Reproductive/Obstetrics (+) Pregnancy                             Anesthesia Physical Anesthesia Plan  ASA: III  Anesthesia Plan: Spinal   Post-op Pain Management:    Induction:   Airway Management Planned:   Additional Equipment:   Intra-op Plan:   Post-operative Plan:   Informed Consent: I have reviewed the patients History and Physical, chart, labs and discussed the procedure including the risks, benefits and alternatives for the proposed anesthesia with the patient or authorized representative who has indicated his/her understanding and acceptance.     Plan Discussed with: CRNA and Surgeon  Anesthesia Plan Comments:         Anesthesia Quick Evaluation

## 2015-09-21 NOTE — Op Note (Signed)
Cesarean Section Operative Report  April Chan  09/21/2015  Indications: repeat cesarea section, fetal macrosomia  Pre-operative Diagnosis: REPEAT c-section: Desires Sterilization, fetal macrosomia. Bilateral tubal ligation.  Post-operative Diagnosis: Same   Surgeon: Surgeon(s) and Role:    * Reva Bores, MD - Primary    * Kathrynn Running, MD - Assisting   Attending Attestation: I was present and scrubbed for the entire procedure.   Assistants: none  Anesthesia: spinal    Estimated Blood Loss: 700 ml  Total IV Fluids: 2300 ml LR  Urine Output: 250 cc yellow cloudy urine  Specimens: none  Findings: Viable female infant in cephalic presentation. Apgars 9/9, weight 4860 g. Arterial cord pH not obtained. Clear amniotic fluid. Intact placenta, three vessel cord. Normal uterus, fallopian tubes and ovaries bilaterally.  Baby condition / location:  Couplet care / Skin to Skin     Complications: no complications  Indications: April Chan is a 34 y.o. [phone] with an IUP [redacted]w[redacted]d presenting for scheduled repeat with fetal macrosomia.  The risks, benefits, complications, treatment options, and expected outcomes were discussed with the patient . The patient concurred with the proposed plan, giving informed consent. identified as April Chan and the procedure verified as C-Section Delivery.  Procedure Details: A Time Out was held and the above information confirmed.  The patient was taken back to the operative suite where spinal anesthesia was placed.  After induction of anesthesia, the patient was draped and prepped in the usual sterile manner and placed in a dorsal supine position with a leftward tilt. A phannensteil incision was made and carried down through the subcutaneous tissue to the fascia. Fascial incision was made and extended transversely. The fascia was separated from the underlying rectus tissue superiorly and inferiorly. The peritoneum was identified and entered  bluntly. Alexis retractor placed. A low transverse uterine incision was made. Delivered from cephalic presentation was a 4860 gram Female with Apgar scores of 9 at one minute and 9 at five minutes. Cord ph was not sent the umbilical cord was clamped and cut cord blood was obtained for evaluation. The placenta was removed Intact and appeared normal. The uterine outline, tubes and ovaries appeared normal}.  There was no bleeding.The uterine incision was closed with running locked sutures of 0Vicryl. The Fallopian tubes were identified bilaterally.  A Filshie clip was placed on each tube without difficulty 3 cm from the cornua.  There was persistent bleeding at the left margin of the uterine incision that did not respond to figure-of-eight sutures.  Hemostasis was observed after application of topical hemostatic agent Avitene.  The fascia was then reapproximated with running sutures of 0Vicryl. The subcuticular closure was performed using 2-0 Plain Gut. The skin was closed with 4-0Vicryl. Wound vac was placed.  Instrument, sponge, and needle counts were correct prior the abdominal closure and were correct at the conclusion of the case.     Disposition: PACU - hemodynamically stable.   Maternal Condition: stable       Signed: Lavonne Chick 09/21/2015 10:46 AM

## 2015-09-21 NOTE — Anesthesia Postprocedure Evaluation (Signed)
  Anesthesia Post-op Note  Patient: April Chan  Procedure(s) Performed: Procedure(s): CESAREAN SECTION (N/A)  Patient Location: PACU  Anesthesia Type:Spinal  Level of Consciousness: awake, alert  and oriented  Airway and Oxygen Therapy: Patient Spontanous Breathing  Post-op Pain: none  Post-op Assessment: Post-op Vital signs reviewed, Patient's Cardiovascular Status Stable, Respiratory Function Stable, Patent Airway, No signs of Nausea or vomiting, Pain level controlled, No headache, No backache and Spinal receding LLE Motor Response: Purposeful movement LLE Sensation: Full sensation RLE Motor Response: Purposeful movement RLE Sensation: Full sensation      Post-op Vital Signs: Reviewed and stable  Last Vitals:  Filed Vitals:   09/21/15 1200  BP: 94/52  Pulse: 74  Temp:   Resp: 20    Complications: No apparent anesthesia complications

## 2015-09-22 LAB — CBC
HCT: 29.3 % — ABNORMAL LOW (ref 36.0–46.0)
Hemoglobin: 9.7 g/dL — ABNORMAL LOW (ref 12.0–15.0)
MCH: 27.3 pg (ref 26.0–34.0)
MCHC: 33.1 g/dL (ref 30.0–36.0)
MCV: 82.5 fL (ref 78.0–100.0)
PLATELETS: 167 10*3/uL (ref 150–400)
RBC: 3.55 MIL/uL — ABNORMAL LOW (ref 3.87–5.11)
RDW: 15.6 % — AB (ref 11.5–15.5)
WBC: 9.4 10*3/uL (ref 4.0–10.5)

## 2015-09-22 LAB — BIRTH TISSUE RECOVERY COLLECTION (PLACENTA DONATION)

## 2015-09-22 LAB — GLUCOSE, CAPILLARY
GLUCOSE-CAPILLARY: 149 mg/dL — AB (ref 65–99)
Glucose-Capillary: 148 mg/dL — ABNORMAL HIGH (ref 65–99)
Glucose-Capillary: 121 mg/dL — ABNORMAL HIGH (ref 65–99)

## 2015-09-22 MED ORDER — BISACODYL 10 MG RE SUPP
10.0000 mg | Freq: Every day | RECTAL | Status: DC | PRN
  Administered 2015-09-22: 10 mg via RECTAL
  Filled 2015-09-22: qty 1

## 2015-09-22 NOTE — Lactation Note (Signed)
This note was copied from the chart of April Myliah Medel. Lactation Consultation Note Follow up visit at 37 hours of age.  Baby is in NICU and mom reports she already has NICU booklet.  Mom reports visiting baby today.  Mom is pumping about every 3 hours on preemie setting and is hand expressing a small amount of colostrum.  Mom is tearful in pain, MBU RN at bedside with medicine.  Encouraged mom to rest and keep pumping.    Patient Name: April Chan [id number] Date: 09/22/2015 Reason for consult: Follow-up assessment;NICU baby   Maternal Data    Feeding Feeding Type: Formula Nipple Type: Slow - flow Length of feed: 20 min  LATCH Score/Interventions                      Lactation Tools Discussed/Used     Consult Status Consult Status: Follow-up Date: 09/23/15 Follow-up type: In-patient    Shoptaw, Arvella Merles 09/22/2015, 10:55 PM

## 2015-09-22 NOTE — Progress Notes (Signed)
Subjective: Postpartum Day 1: Cesarean Delivery & BTL Eating, drinking, voiding, ambulating well. No flatus.  Lochia and pain wnl.  Denies dizziness, lightheadedness, or sob. No complaints.    Objective: Vital signs in last 24 hours: Temp:  [97.5 F (36.4 C)-98.9 F (37.2 C)] 98.9 F (37.2 C) (10/01 0506) Pulse Rate:  [56-96] 76 (10/01 0506) Resp:  [15-35] 20 (10/01 0506) BP: (76-110)/(32-70) 104/62 mmHg (10/01 0506) SpO2:  [96 %-100 %] 98 % (10/01 0506)  Physical Exam:  General: alert, cooperative and no distress Lochia: appropriate Uterine Fundus: firm Incision: healing well, no significant drainage, no dehiscence, no significant erythema, wound vac intact DVT Evaluation: No evidence of DVT seen on physical exam. Negative Homan's sign. No cords or calf tenderness. No significant calf/ankle edema.   Recent Labs  09/21/15 1135 09/22/15 0520  HGB 9.7* 9.7*  HCT 29.5* 29.3*    Assessment/Plan: Status post Cesarean section & BTL. Doing well postoperatively.  Continue current care. Pumping Baby in NICU for hypoglycemia  April Chan 09/22/2015, 9:51 AM

## 2015-09-23 LAB — GLUCOSE, CAPILLARY
GLUCOSE-CAPILLARY: 92 mg/dL (ref 65–99)
GLUCOSE-CAPILLARY: 162 mg/dL — AB (ref 65–99)
GLUCOSE-CAPILLARY: 106 mg/dL — AB (ref 65–99)
Glucose-Capillary: 116 mg/dL — ABNORMAL HIGH (ref 65–99)

## 2015-09-23 NOTE — Progress Notes (Signed)
Subjective: Post-Op Day 2: Cesarean Delivery & BTL Eating, drinking, voiding, ambulating well. Flatus today.  Lochia and pain wnl.  Denies dizziness, lightheadedness, or sob. Some vague complaints of depressed mood due to baby being in NICU  Objective: Vital signs in last 24 hours: Temp:  [96.7 F (35.9 C)-98.4 F (36.9 C)] 97.9 F (36.6 C) (10/02 0544) Pulse Rate:  [63-74] 63 (10/02 0544) Resp:  [18-20] 18 (10/02 0544) BP: (102-115)/(55-69) 115/69 mmHg (10/02 0544) SpO2:  [100 %] 100 % (10/01 1900)  Physical Exam:  General: alert, cooperative and no distress Lochia: appropriate Uterine Fundus: firm Incision: healing well, no significant drainage, no dehiscence, no significant erythema DVT Evaluation: No evidence of DVT seen on physical exam. Negative Homan's sign. No cords or calf tenderness. Some pitting edema present bilaterally to ankles.    Recent Labs  09/21/15 1135 09/22/15 0520  HGB 9.7* 9.7*  HCT 29.5* 29.3*    Assessment/Plan: Status post Cesarean section & BTL. Doing well postoperatively.  Continue current care. Pumping Baby in NICU for hypoglycemia  Mickie Hillier 09/23/2015, 8:27 AM  i assessed and agree with assessment

## 2015-09-23 NOTE — Lactation Note (Addendum)
This note was copied from the chart of April Chan. Lactation Consultation Note  Mother has not pumped this morning.  But has been pumping and sending small drop amounts to NICU. She plans to pump shortly.  Reminded her to hand express before and after pumping. Mother has history of low milk supply. Discussed fenugreek and advised mother not to take due to history of diabetes and asthma. Mother states she has [facility] and referral has been sent for DEBP. Did not see note for [facility] referral in chart so faxed another.  Patient Name: April Chan [id number] Date: 09/23/2015 Reason for consult: Follow-up assessment   Maternal Data    Feeding    [facility] Score/Interventions                      Lactation Tools Discussed/Used     Consult Status      Hardie Pulley 09/23/2015, 10:18 AM

## 2015-09-24 ENCOUNTER — Encounter (HOSPITAL_COMMUNITY): Payer: Self-pay | Admitting: Family Medicine

## 2015-09-24 LAB — GLUCOSE, CAPILLARY: Glucose-Capillary: 107 mg/dL — ABNORMAL HIGH (ref 65–99)

## 2015-09-24 MED ORDER — IBUPROFEN 600 MG PO TABS: 600.0000 mg | ORAL_TABLET | Freq: Four times a day (QID) | ORAL | Status: DC | PRN

## 2015-09-24 MED ORDER — SERTRALINE HCL 50 MG PO TABS
50.0000 mg | ORAL_TABLET | Freq: Every day | ORAL | Status: DC
  Administered 2015-09-24: 50 mg via ORAL
  Filled 2015-09-24 (×2): qty 1

## 2015-09-24 MED ORDER — OXYCODONE-ACETAMINOPHEN 5-325 MG PO TABS: 2.0000 | ORAL_TABLET | ORAL | Status: DC | PRN

## 2015-09-24 MED ORDER — SERTRALINE HCL 50 MG PO TABS: 50.0000 mg | ORAL_TABLET | Freq: Every day | ORAL | Status: AC

## 2015-09-24 NOTE — Discharge Summary (Signed)
OB Discharge Summary  Patient Name: April Chan DOB: 1981/01/23 MRN: [id number]  Date of admission: 09/21/2015 Delivering MD: Reva Bores   Date of discharge: 09/24/2015  Admitting diagnosis: cpt [phone] - REPEAT c-section Intrauterine pregnancy: [redacted]w[redacted]d     Secondary diagnosis: Gestational Diabetes medication controlled (A2)     Discharge diagnosis: Term Pregnancy Delivered                                                                                                Post partum procedures:postpartum tubal ligation  Augmentation: none  Complications: None  Hospital course:  Sceduled C/S   34 y.o. yo G3P3003 at [redacted]w[redacted]d was admitted to the hospital 09/21/2015 for scheduled cesarean section with the following indication:Elective Repeat.  Membrane Rupture Time/Date: 9:36 AM ,09/21/2015   Patient delivered a Viable infant.09/21/2015  Details of operation can be found in separate operative note.  Pateint had an uncomplicated postpartum course.  She is ambulating, tolerating a regular diet, passing flatus, and urinating well. Patient is discharged home in stable condition on No discharge date for patient encounter.          Physical exam  Filed Vitals:   09/22/15 1900 09/23/15 0544 09/23/15 1749 09/24/15 0600  BP: 109/55 115/69 95/47 110/82  Pulse: 74 63 81 63  Temp: 98.4 F (36.9 C) 97.9 F (36.6 C) 97.9 F (36.6 C) 97.8 F (36.6 C)  TempSrc:  Oral Oral   Resp: SpO2: 100%  100%    General: alert, cooperative and no distress Lochia: appropriate Uterine Fundus: firm Incision: Healing well with no significant drainage, No significant erythema, Dressing is clean, dry, and intact DVT Evaluation: No evidence of DVT seen on physical exam. Negative Homan's sign. No cords or calf tenderness. Labs: Lab Results  Component Value Date   WBC 9.4 09/22/2015   HGB 9.7* 09/22/2015   HCT 29.3* 09/22/2015   MCV 82.5 09/22/2015   PLT 167 09/22/2015   CMP Latest Ref Rng  09/20/2015  Glucose 65 - 99 mg/dL 308(M)  BUN 6 - 20 mg/dL 8  Creatinine 5.78 - 4.69 mg/dL 6.29  Sodium 528 - 413 mmol/L 135  Potassium 3.5 - 5.1 mmol/L 3.9  Chloride 101 - 111 mmol/L 107  CO2 22 - 32 mmol/L 20(L)  Calcium 8.9 - 10.3 mg/dL 8.3(L)  Total Protein 6.1 - 8.1 g/dL -  Total Bilirubin 0.2 - 1.2 mg/dL -  Alkaline Phos 33 - 244 U/L -  AST 10 - 30 U/L -  ALT 6 - 29 U/L -    Discharge instruction: per After Visit Summary and "Baby and Me Booklet".  Medications:  Current facility-administered medications:  .  acetaminophen (TYLENOL) tablet 650 mg, 650 mg, Oral, Q4H PRN, Kathrynn Running, MD .  bisacodyl (DULCOLAX) suppository 10 mg, 10 mg, Rectal, Daily PRN, Kathee Delton, MD, 10 mg at 09/22/15 2041 .  witch hazel-glycerin (TUCKS) pad 1 application, 1 application, Topical, PRN **AND** dibucaine (NUPERCAINAL) 1 % rectal ointment 1 application, 1 application, Rectal, PRN, Kathrynn Running, MD .  diphenhydrAMINE (BENADRYL) injection 12.5 mg, 12.5 mg, Intravenous, Q4H PRN **OR** diphenhydrAMINE (BENADRYL) capsule 25 mg, 25 mg, Oral, Q4H PRN, Leilani Able, MD, 25 mg at 09/21/15 2210 .  diphenhydrAMINE (BENADRYL) capsule 25 mg, 25 mg, Oral, Q6H PRN, Kathrynn Running, MD .  ibuprofen (ADVIL,MOTRIN) tablet 600 mg, 600 mg, Oral, Q6H PRN, Leilani Able, MD, 600 mg at 09/23/15 0536 .  ibuprofen (ADVIL,MOTRIN) tablet 600 mg, 600 mg, Oral, 4 times per day, Kathrynn Running, MD, 600 mg at 09/24/15 [phone] .  lactated ringers infusion, , Intravenous, Continuous, Kathrynn Running, MD .  lanolin ointment 1 application, 1 application, Topical, PRN, Kathrynn Running, MD .  menthol-cetylpyridinium (CEPACOL) lozenge 3 mg, 1 lozenge, Oral, Q2H PRN, Kathrynn Running, MD .  metFORMIN (GLUCOPHAGE) tablet 500 mg, 500 mg, Oral, BID WC, Durenda Hurt, MD, 500 mg at 09/23/15 1800 .  nalbuphine (NUBAIN) injection 5 mg, 5 mg, Intravenous, Q4H PRN **OR** nalbuphine (NUBAIN) injection 5 mg, 5 mg,  Subcutaneous, Q4H PRN, Leilani Able, MD .  nalbuphine (NUBAIN) injection 5 mg, 5 mg, Intravenous, Once PRN **OR** nalbuphine (NUBAIN) injection 5 mg, 5 mg, Subcutaneous, Once PRN, Leilani Able, MD .  naloxone (NARCAN) 2 mg in dextrose 5 % 250 mL infusion, 1-4 mcg/kg/hr, Intravenous, Continuous PRN, Leilani Able, MD .  naloxone [facility]) injection 0.4 mg, 0.4 mg, Intravenous, PRN **AND** sodium chloride 0.9 % injection 3 mL, 3 mL, Intravenous, PRN, Leilani Able, MD .  ondansetron (ZOFRAN) injection 4 mg, 4 mg, Intravenous, Q8H PRN, Leilani Able, MD .  oxyCODONE-acetaminophen (PERCOCET/ROXICET) 5-325 MG per tablet 1 tablet, 1 tablet, Oral, Q4H PRN, Kathrynn Running, MD, 1 tablet at 09/22/15 0521 .  oxyCODONE-acetaminophen (PERCOCET/ROXICET) 5-325 MG per tablet 2 tablet, 2 tablet, Oral, Q4H PRN, Kathrynn Running, MD, 2 tablet at 09/24/15 0129 .  prenatal multivitamin tablet 1 tablet, 1 tablet, Oral, Q1200, Kathrynn Running, MD, 1 tablet at 09/23/15 1220 .  senna-docusate (Senokot-S) tablet 2 tablet, 2 tablet, Oral, Q24H, Kathrynn Running, MD, 2 tablet at 09/22/15 2257 .  simethicone (MYLICON) chewable tablet 80 mg, 80 mg, Oral, TID PC, Kathrynn Running, MD, 80 mg at 09/23/15 1801 .  simethicone (MYLICON) chewable tablet 80 mg, 80 mg, Oral, Q24H, Kathrynn Running, MD, 80 mg at 09/24/15 0118 .  simethicone (MYLICON) chewable tablet 80 mg, 80 mg, Oral, PRN, Kathrynn Running, MD, 80 mg at 09/22/15 2258 .  zolpidem (AMBIEN) tablet 5 mg, 5 mg, Oral, QHS PRN, Kathrynn Running, MD  Diet: carb modified diet  Activity: Advance as tolerated. Pelvic rest for 6 weeks.   Outpatient follow up:6 weeks  Postpartum contraception: Tubal Ligation  Newborn Data: Live born female  Birth Weight: 10 lb 11.4 oz (4859 g) APGAR: 9, 9  Baby Feeding: Bottle and Breast Disposition:NICU   09/24/2015 Enolia Koepke, Gershon Mussel, CNM

## 2015-09-24 NOTE — Clinical Social Work Maternal (Signed)
CLINICAL SOCIAL WORK MATERNAL/CHILD NOTE  Patient Details  Name: April Chan MRN: [id number] Date of Birth: 1981/06/26  Date:  09/24/2015  Clinical Social Worker Initiating Note:  April Chan, April Chan Date/ Time Initiated:  09/24/15/1000     Child's Name:  April Chan   Legal Guardian:   (Parents: April Chan and April Chan)   Need for Interpreter:  None   Date of Referral:        Reason for Referral:   (No referral-NICU admission)   Referral Source:      Address:  [address] [place], [place] [address]  Phone number:  [phone]   Household Members:  Minor Children, Spouse (Couple has one other child: April Chan/15 months, and MOB has a 42 year old son/April Chan from a previous relationship)   Natural Supports (not living in the home):  Friends, Extended Family, Immediate Family   Professional Supports:  (MOB is interested in outpatient counseling for Depression.  CSW provided MOB with a list of nearby therapists who accept [facility].)   Employment: Full-time   Type of Work:  (FOB works in Probation officer" for [employer].  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  She plans to apply for Medicaid.)   Other Resources:   (MOB states she has an application appointment at [facility] on 09/26/15.)   Cultural/Religious Considerations Which May Impact Care: None stated.  MOB's facesheet notes religion as Non-Denominational.  Strengths:  Ability to meet basic needs , Compliance with medical plan , Home prepared for child  (Pediatric [facility] will [facility] at [facility]).  MOB reports minimal basic supplies for baby.  CSW will make referral to [facility]. MOB states they have a crib.)   Risk Factors/Current Problems:  Mental Health Concerns  (MOB reports feeling depressed through this pregnancy.)    Cognitive State:  Distractible , Goal Oriented    Mood/Affect:  Tearful , Interested , Calm    CSW Assessment: CSW met with parents and MGF in MOB's first floor room/137 to introduce services, offer support and complete assessment due to baby's admission to NICU.  Parents were welcoming of CSW's visit and MOB gave permission to talk with company present.  Eventually, MGF left to visit the baby.  Assessment was difficult to complete due to couple's 59 month old present in the room.   MOB reports feeling depressed due to baby's admission to NICU.  She states she is worried about leaving her in the hospital at her own discharge.  MOB states she has felt depressed throughout the pregnancy, and describes her pregnancy as "rough."  She reports a strong family history of Depression.  MOB denies feelings of depression or anxiety after the births of her first two children.  CSW validated her feelings and helped her process her feelings regarding baby's admission to NICU.  MOB notes multiple stressors, mainly associated with finances.  She is able to identify positive aspects to her life, mainly in her husband and children.  CSW discussed the use of antidepressant medications coupled with therapy to treat Depression.  MOB is interested in both medication and counseling.  She states she took an antidepressant 10 years ago and does not recall what she took.  She has no preference regarding what kind of medication she would like to try.  CSW contacted Dr. Mervyn Chan to recommend starting an antidepressant.  He states he will start Zoloft.  MOB is pleased.  CSW provided MOB with a list of outpatient counselors in the area who report acceptance of MOB's insurance.  CSW also provided MOB with the names of two area Psychiatrists and informed her of the importance of follow up with a Psychiatrist.  MOB states understanding and thanked CSW for the information/support.   Parents report having some baby items for baby at  home, including a crib.  CSW offered assistance through [employer] for baby basics.  Parents were appreciative.  They report having good supports and transportation to the hospital to see baby after MOB's discharge.  FOB reports that he has 4 days off from work at this time.  They wish to apply for Medicaid for baby.  CSW left message for April Chan and told parents that they may need to complete application at the Department of Health and Human Services.   CSW provided contact information and explained ongoing support services offered by NICU CSW.  Parents were appreciative of CSW's time and support.  CSW Plan/Description:  Psychosocial Support and Ongoing Assessment of Needs, Information/Referral to [employer] , Patient/Family Education     April Chan [place], [place] 09/24/2015, 12:18 PM

## 2015-09-24 NOTE — Lactation Note (Signed)
This note was copied from the chart of April Janeka Libman. Lactation Consultation Note  Follow up with mom prior to D/C. Baby is in NICU but mom reports she may be going home with baby this afternoon. Mom attempted to BF 34 year old and 45 month old and reports she only ever received up to 5 cc per pumping. Mom has tubular shaped breasts and denies any other physiologic causes of Low milk supply. Mom was offered a DEBP for home and reports she is active with WIC. She has a [facility] appointment on Wednesday to get a pump. She declined a DEBP to take home and wanted a hand pump, one was given and explained. Mom reports she is planning to BF infant and home and will be giving supplement at home. Enc mom to call with questions/concerns. Mom is aware of OP services and Support Groups. Patient Name: April Chan [id number] Date: 09/24/2015 Reason for consult: Follow-up assessment   Maternal Data    Feeding Feeding Type: Formula Nipple Type: Slow - flow Length of feed: 25 min  LATCH Score/Interventions                      Lactation Tools Discussed/Used     Consult Status Consult Status: Complete Follow-up type: Call as needed    Ed Blalock 09/24/2015, 11:20 AM

## 2015-09-25 ENCOUNTER — Telehealth: Payer: Self-pay | Admitting: General Practice

## 2015-09-25 NOTE — Telephone Encounter (Signed)
Patient called and left message stating she was discharged home from the hospital today and was given no instructions on follow up or how to take care of the wound or the wound vac. Patient states she doesn't know how she is supposed to shower. Patient states she has no appt for follow up. Called patient and discussed that the wound vac will turn off on its own at a week. When that happens she can disconnect the vac and take the bandage off. Told patient if she has steri strips on underneath that running across her incision to leave those until they fall off on their own. Discussed no shower until the wound vac comes off. Discussing showering with incision and S&S of infection. Patient verbalized understanding to all and had no questions

## 2015-09-26 ENCOUNTER — Telehealth: Payer: Self-pay | Admitting: *Deleted

## 2015-09-26 NOTE — Telephone Encounter (Signed)
Pt contacted clinic requesting call back from nurse regarding burning sensation on rt side of abdomen.  Contacted patient, discussed that burning is a normal sensation after cesarean section.  Pt verbalizes understanding.

## 2015-10-01 ENCOUNTER — Ambulatory Visit: Payer: BC Managed Care – PPO | Admitting: *Deleted

## 2015-10-01 VITALS — BP 111/72 | HR 64 | Temp 98.1°F

## 2015-10-01 DIAGNOSIS — G8918 Other acute postprocedural pain: Secondary | ICD-10-CM

## 2015-10-01 MED ORDER — OXYCODONE-ACETAMINOPHEN 5-325 MG PO TABS: 2.0000 | ORAL_TABLET | ORAL | Status: AC | PRN

## 2015-10-01 NOTE — Progress Notes (Signed)
Pt in for wound vac removal. Reports pain on right side of incision, feels like a hot stabbing pain. Wound vac removed, incision is clean and dry, well approximated. Advised patient to keep pp appointment and to call if she needs anything prior to that. Patient is agreeable. Pt requested refill on pain meds. Rx given by Dr. Macon Large for Percocet. No further refills to be given.

## 2015-10-02 ENCOUNTER — Ambulatory Visit: Payer: BC Managed Care – PPO

## 2015-10-15 ENCOUNTER — Encounter: Payer: Self-pay | Admitting: *Deleted

## 2015-10-19 ENCOUNTER — Ambulatory Visit: Payer: BC Managed Care – PPO | Admitting: Family Medicine

## 2015-11-01 ENCOUNTER — Encounter: Payer: Self-pay | Admitting: Medical

## 2015-11-01 ENCOUNTER — Ambulatory Visit (INDEPENDENT_AMBULATORY_CARE_PROVIDER_SITE_OTHER): Payer: BC Managed Care – PPO | Admitting: Medical

## 2015-11-01 DIAGNOSIS — E119 Type 2 diabetes mellitus without complications: Secondary | ICD-10-CM

## 2015-11-01 DIAGNOSIS — Z98891 History of uterine scar from previous surgery: Secondary | ICD-10-CM

## 2015-11-01 DIAGNOSIS — Z8759 Personal history of other complications of pregnancy, childbirth and the puerperium: Secondary | ICD-10-CM | POA: Insufficient documentation

## 2015-11-01 NOTE — Progress Notes (Signed)
Patient ID: April Chan, female   DOB: 02/12/1981, 34 y.o.   MRN: [id number] Subjective:     April Chan is a 34 y.o. female who presents for a postpartum visit. She is 6 weeks postpartum following a low cervical transverse Cesarean section. I have fully reviewed the prenatal and intrapartum course. The delivery was at 39 gestational weeks. Outcome: repeat cesarean section, low transverse incision. Anesthesia: spinal. Postpartum course has been unremarkable. Baby's course has been unremarkable. Baby is feeding by both breast and bottle - Similac Advance. Bleeding staining only. Bowel function is normal. Bladder function is normal. Patient is sexually active. Contraception method is tubal ligation. Postpartum depression screening: negative.  The following portions of the patient's history were reviewed and updated as appropriate: allergies, current medications, past family history, past medical history, past social history, past surgical history and problem list.  Review of Systems Pertinent items are noted in HPI.   Objective:    BP 110/63 mmHg  Pulse 80  Temp(Src) 98.2 F (36.8 C)  Wt 289 lb (131.09 kg)  Breastfeeding? Yes  General:  alert and cooperative   Breasts:  not evaluated  Lungs: clear to auscultation bilaterally  Heart:  regular rate and rhythm, S1, S2 normal, no murmur, click, rub or gallop  Abdomen: soft, non-tender; bowel sounds normal; no masses,  no organomegaly, incision well-healed   Vulva:  not evaluated  Vagina: not evaluated  Cervix:  not evaluated  Corpus: not examined  Adnexa:  not evaluated  Rectal Exam: Not performed.        Assessment:     Normal postpartum exam. Pap smear not done at today's visit.  Patient states [facility].  Type 2 DM  Plan:    1. Contraception: tubal ligation, performed postpartum 2. Patient advised to establish with a local PCP for management of DM, continue Metformin until otherwise  instructed by PCP 3. Follow up in: 1 year or sooner as needed.    Marny LowensteinJulie N Wenzel, PA-C 11/01/2015 4:05 PM

## 2015-11-01 NOTE — Patient Instructions (Signed)

## 2016-02-12 ENCOUNTER — Emergency Department (HOSPITAL_COMMUNITY)
Admission: EM | Admit: 2016-02-12 | Discharge: 2016-02-12 | Disposition: A | Discharge status: Home / Self Care | Payer: BC Managed Care – PPO | Source: Home / Self Care | Attending: Family Medicine | Admitting: Family Medicine

## 2016-02-12 ENCOUNTER — Encounter (HOSPITAL_COMMUNITY): Payer: Self-pay | Admitting: Emergency Medicine

## 2016-02-12 DIAGNOSIS — J069 Acute upper respiratory infection, unspecified: Secondary | ICD-10-CM

## 2016-02-12 DIAGNOSIS — J111 Influenza due to unidentified influenza virus with other respiratory manifestations: Secondary | ICD-10-CM

## 2016-02-12 MED ORDER — AMOXICILLIN 500 MG PO CAPS: 500.0000 mg | ORAL_CAPSULE | Freq: Three times a day (TID) | ORAL | Status: DC

## 2016-02-12 NOTE — ED Provider Notes (Signed)
CSN: [id number]     Arrival date & time 02/12/16  1659 History   First MD Initiated Contact with Patient 02/12/16 1906     No chief complaint on file.  (Consider location/radiation/quality/duration/timing/severity/associated sxs/prior Treatment) HPI Patient states that she has been sick for about one week now with cough, fever, body aches, sore throat. She states that she is using over-the-counter medications without any relief of her symptoms. 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 Patient is diabetic. She states that she did get a flu shot this year. Past Medical History  Diagnosis Date  . Asthma   . Diabetes mellitus without complication (HCC)     oral and insulin  . Depression     h/o pp depression  . Anxiety   . GERD (gastroesophageal reflux disease)   . Gestational diabetes    Past Surgical History  Procedure Laterality Date  . Tonsillectomy    . Wisdom tooth extraction Bilateral   . Cesarean section N/A 06/14/2014    Procedure: CESAREAN SECTION;  Surgeon: Brock Bad, MD;  Location: [facility];  Service: Obstetrics;  Laterality: N/A;  . Cesarean section N/A 09/21/2015    Procedure: CESAREAN SECTION;  Surgeon: Reva Bores, MD;  Location: [facility];  Service: Obstetrics;  Laterality: N/A;   Family History  Problem Relation Age of Onset  . Polycystic kidney disease Mother   . Hypertension Father   . Diabetes Father   . Heart disease Maternal Grandfather   . Diabetes Paternal Grandmother   . Hypertension Paternal Grandmother   . Cancer Paternal Grandmother   . Diabetes Paternal Grandfather   . Heart disease Paternal Grandfather    Social History  Substance Use Topics  . Smoking status: Former Smoker -- 0.25 packs/day    Types: Cigarettes    Quit date: 05/11/2013  . Smokeless tobacco: Never Used  . Alcohol Use: No   OB History    Gravida Para Term Preterm AB TAB SAB Ectopic Multiple Living   0 3      Review of Systems Cough, body aches, fever negative for vomiting, diarrhea Allergies  Review of patient's allergies indicates no known allergies.  Home Medications   Prior to Admission medications   Medication Sig Start Date End Date Taking? Authorizing Provider  amoxicillin (AMOXIL) 500 MG capsule Take 1 capsule (500 mg total) by mouth 3 (three) times daily. 02/12/16   Tharon Aquas, PA  BAYER CONTOUR TEST test strip TEST BLOOD SUGARS 4 TIMES DAILY 02/08/15   Brock Bad, MD  glyBURIDE (DIABETA) 2.5 MG tablet Take 2 tablets (5 mg total) by mouth 2 (two) times daily with a meal. Patient not taking: Reported on 11/01/2015 09/05/15   Rhona Raider Stinson, DO  ibuprofen (ADVIL,MOTRIN) 600 MG tablet Take 1 tablet (600 mg total) by mouth every 6 (six) hours as needed for mild pain. Patient not taking: Reported on 11/01/2015 09/24/15   Montez Morita, CNM  metFORMIN (GLUCOPHAGE) 500 MG tablet Take 1 tablet (500 mg total) by mouth 2 (two) times daily with a meal. Patient taking differently: Take 500 mg by mouth 2 (two) times daily with a meal. Two in the morning and one at night 06/28/15   Marlis Edelson, CNM  metoCLOPramide (REGLAN) 10 MG tablet Take 1 tablet (10 mg total) by mouth 4 (four) times daily. Patient not taking: Reported on 11/01/2015 09/05/15  Rhona Raider Stinson, DO  omeprazole (PRILOSEC) 20 MG capsule TAKE 1 CAPSULE (20 MG TOTAL) BY MOUTH AT BEDTIME. 10/02/14   Brock Bad, MD  oxyCODONE-acetaminophen (PERCOCET/ROXICET) 5-325 MG tablet Take 2 tablets by mouth every 4 (four) hours as needed (for pain scale greater than 7). Patient not taking: Reported on 11/01/2015 10/01/15   Tereso Newcomer, MD  Prenatal Vit-Fe Fumarate-FA (VITAFOL-OB) TABS Take 1 tablet by mouth daily before breakfast. 07/17/14   Brock Bad, MD  sertraline (ZOLOFT) 50 MG tablet Take 1 tablet (50 mg total) by mouth daily. Patient not taking: Reported on 11/01/2015 09/24/15   Levie Heritage, DO  valACYclovir  (VALTREX) 500 MG tablet TAKE 1 TABLET BY MOUTH EVERY DAY Patient not taking: Reported on 11/01/2015 09/01/15   Brock Bad, MD   Meds Ordered and Administered this Visit  Medications - No data to display  BP 133/95 mmHg  Pulse 76  Temp(Src) 97.7 F (36.5 C) (Oral)  Resp 16  SpO2 97% No data found.   Physical Exam NURSES NOTES AND VITAL SIGNS REVIEWED. CONSTITUTIONAL: Well developed, well nourished, no acute distress HEENT: normocephalic, atraumatic, right and left TM's are normal EYES: Conjunctiva normal NECK:normal ROM, supple, no adenopathy PULMONARY:No respiratory distress, normal effort, Lungs: CTAb/l, no wheezes, or increased work of breathing CARDIOVASCULAR: RRR, no murmur ABDOMEN: soft, ND, NT, +'ve BS, without CVA -T MUSCULOSKELETAL: Normal ROM of all extremities,  SKIN: warm and dry without rash PSYCHIATRIC: Mood and affect, behavior are normal  ED Course  Procedures (including critical care time)  Labs Review Labs Reviewed - No data to display  Imaging Review No results found.   Visual Acuity Review  Right Eye Distance:   Left Eye Distance:   Bilateral Distance:    Right Eye Near:   Left Eye Near:    Bilateral Near:        Reassurance is given the patient. She is advised that she is beyond the treatment range for Tamiflu. He is advised that she should push fluids used ibuprofen for body aches. And whatever medications over-the-counter that will help resolve her symptoms. Return to work note is provided to the patient. MDM   1. Flu   2. URI, acute    Patient is advised to continue home symptomatic treatment. Prescription for amoxil sent pharmacy patient has indicated. Patient is advised that if there are new or worsening symptoms or attend the emergency department, or contact primary care provider. Instructions of care provided discharged home in stable condition. Return to work/school note provided.  THIS NOTE WAS GENERATED USING A VOICE  RECOGNITION SOFTWARE PROGRAM. ALL REASONABLE EFFORTS  WERE MADE TO PROOFREAD THIS DOCUMENT FOR ACCURACY.     Tharon Aquas, PA 02/12/16 1931

## 2016-02-12 NOTE — Discharge Instructions (Signed)

## 2016-02-12 NOTE — ED Notes (Signed)
Reports 5 day history of illness/flu.  Seen by Barbra Sarks, PA only.  See PA note

## 2016-10-11 IMAGING — US US MFM OB LIMITED
1 series · 13 of 13 positions shown · non-contrast
Comparison: none

[Series 1: us mfm ob limited · 0.28mm/px · 13 of 13 slices shown]
[im 1/13]
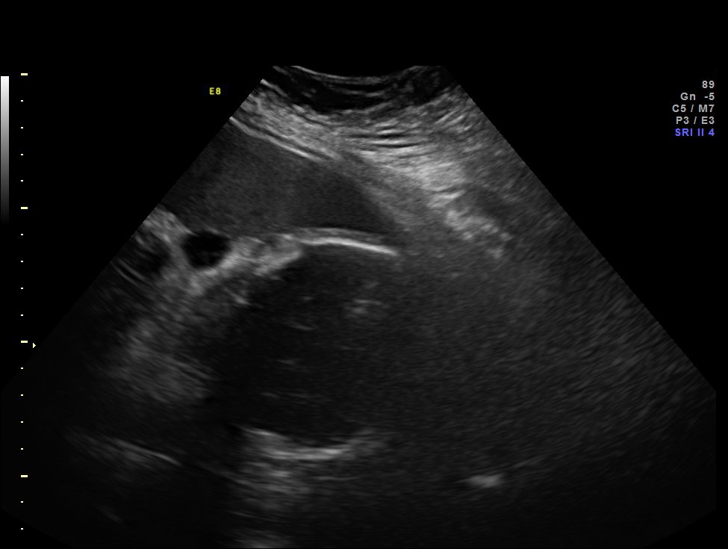
[im 2/13]
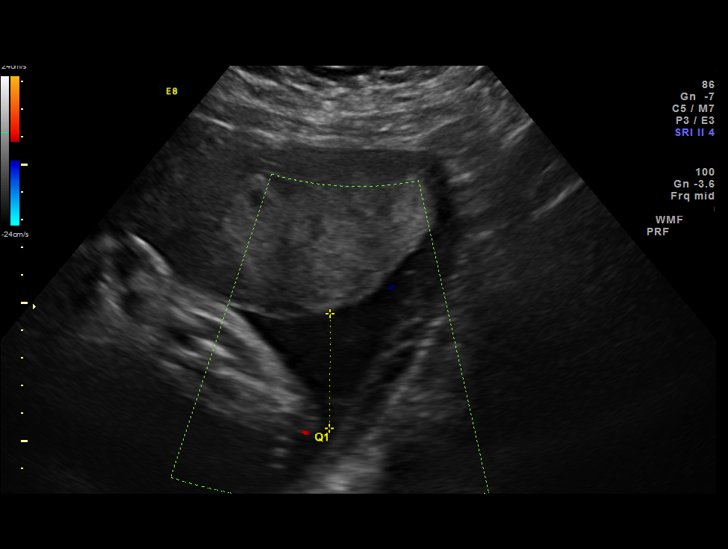
[im 3/13]
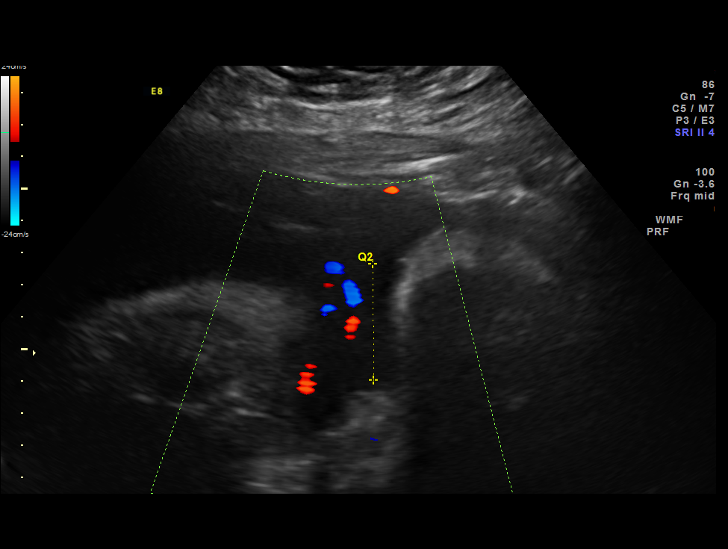
[im 4/13]
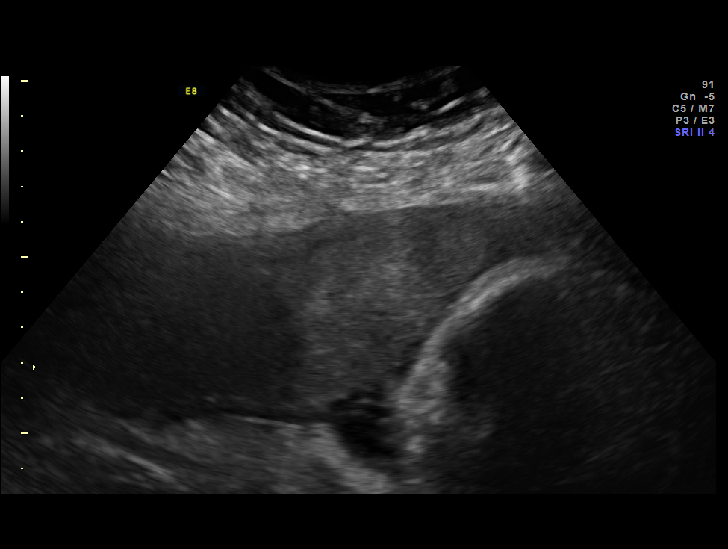
[im 5/13]
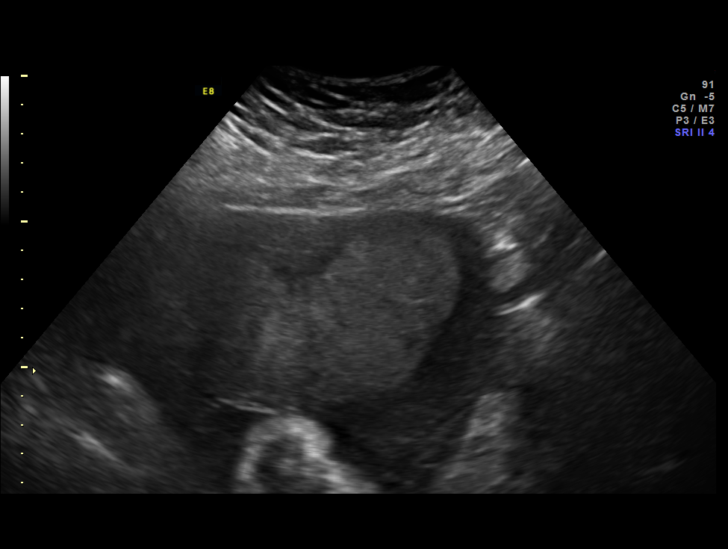
[im 6/13]
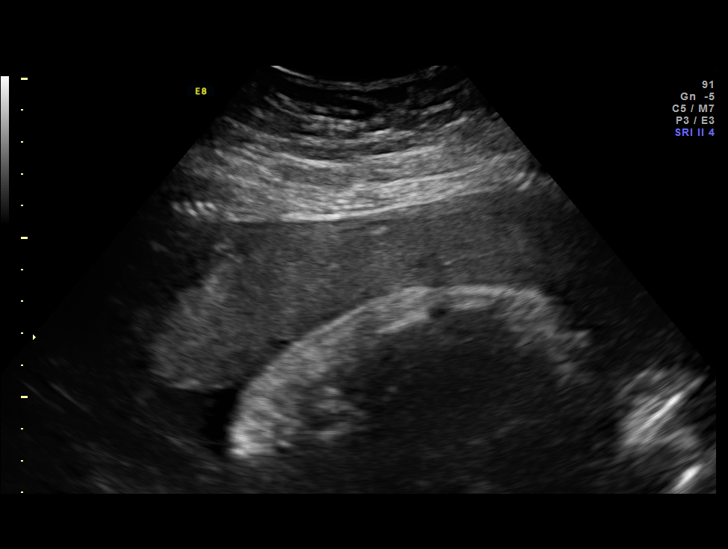
[im 7/13]
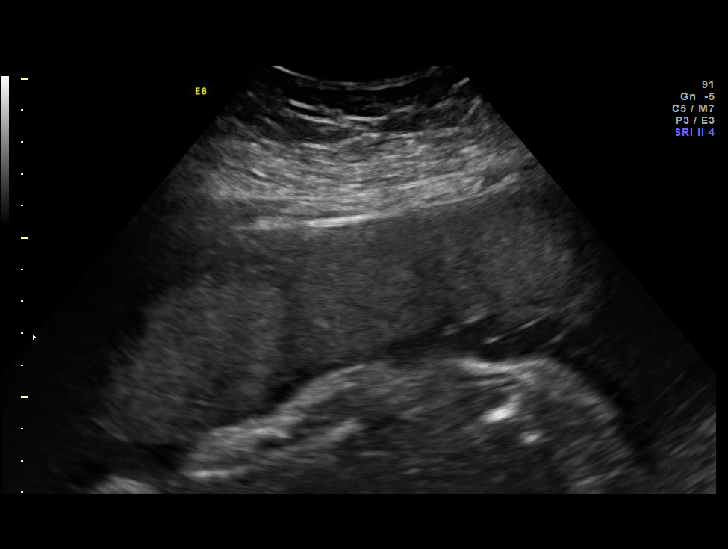
[im 8/13]
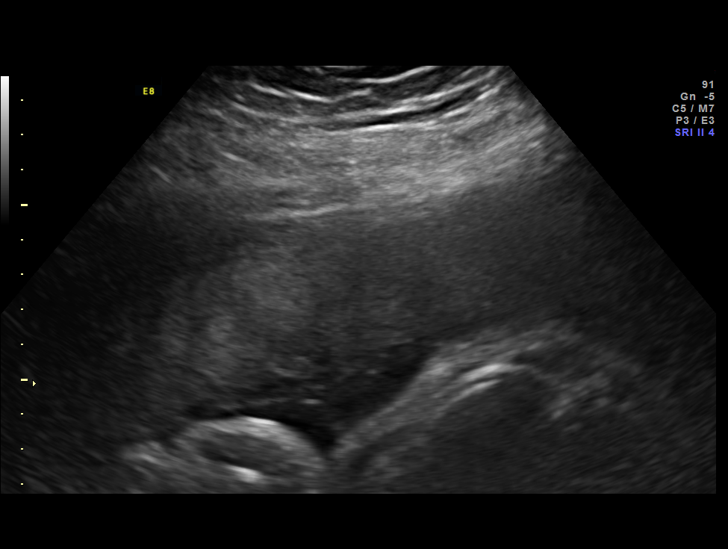
[im 9/13]
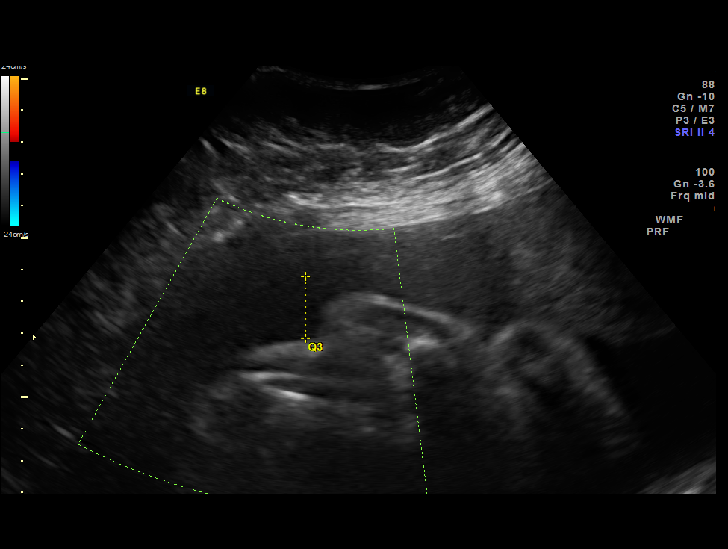
[im 10/13]
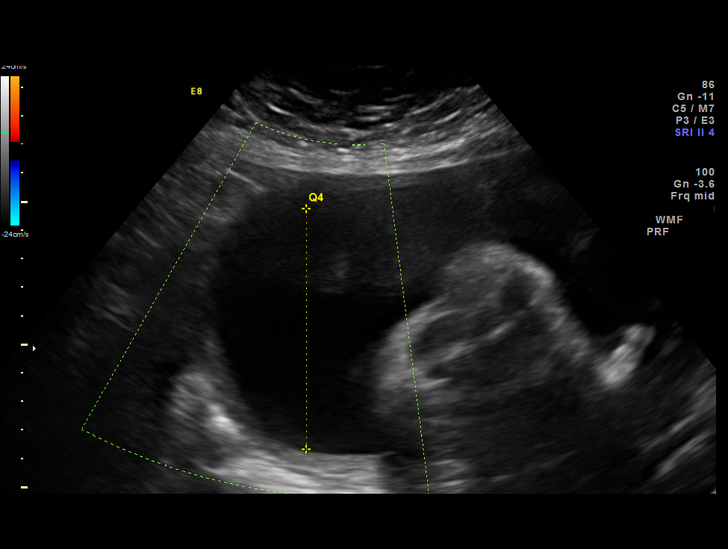
[im 11/13]
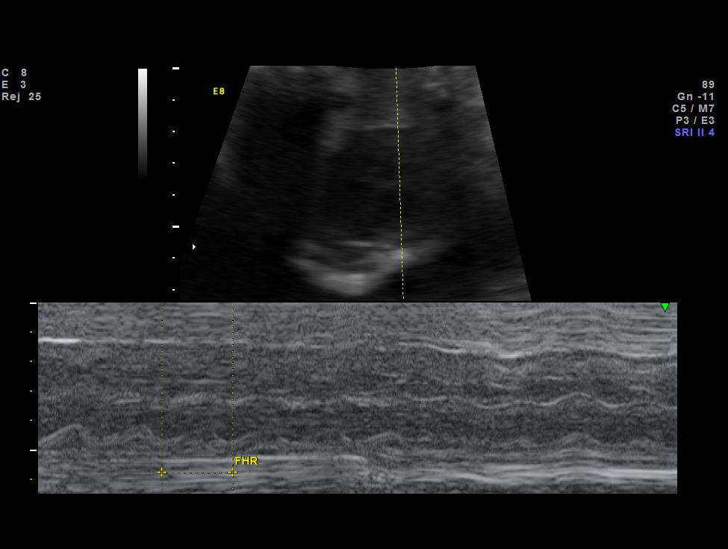
[im 12/13]
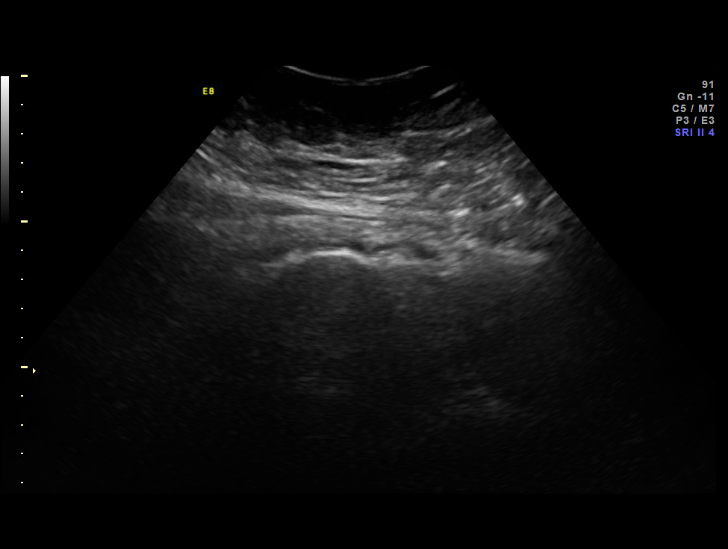
[im 13/13]
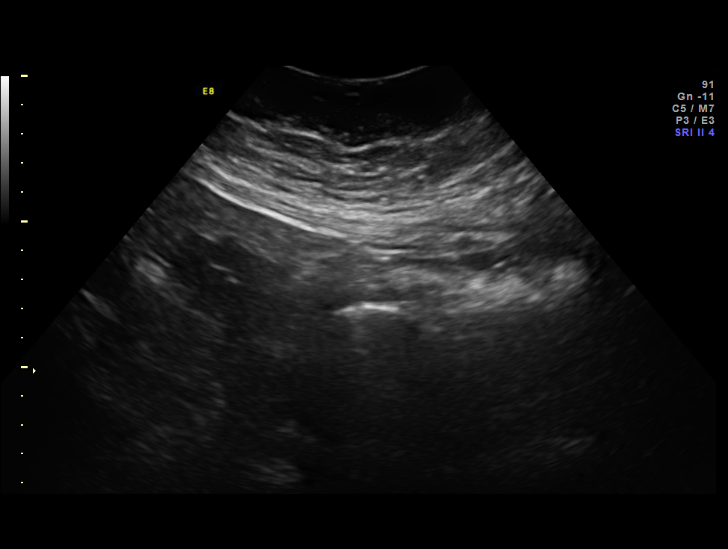

[13 of 13 positions shown; findings below may reference images not displayed]

OBSTETRICS REPORT
(Signed Final 08/03/2015 [DATE])

Name:       MINDIA MESTIASHVILI                        Visit  08/03/2015 [DATE]
Date:

Service(s) Provided

US MFM OB LIMITED                                      76815.01
Indications

32 weeks gestation of pregnancy
Diabetes - Pregestational, 2nd trimester,
Metformin
Maternal morbid obesity, 837lbs, BMI- 47
Previous cesarean section
Fetal Evaluation

Num Of             1
Fetuses:
Fetal Heart        142                          bpm
Rate:
Cardiac Activity:  Observed
Presentation:      Cephalic
Placenta:          Anterior, above cervical
os
P. Cord            Previously Visualized
Insertion:

Amniotic Fluid
AFI FV:      Subjectively within normal limits
AFI Sum:     18.15    cm      67  %Tile     Larg Pckt:    8.44   cm
RUQ:   4.15    cm    RLQ:   8.44    cm   LUQ:    3.62    cm   LLQ:    1.94   cm
Gestational Age

LMP:           32w 0d        Date:  12/22/14                  EDD:   09/28/15
Best:          32w 0d    Det. By:   LMP  (12/22/14)           EDD:   09/28/15
Cervix Uterus Adnexa

Cervix:       Not visualized (advanced GA >94wks)

Adnexa:     No abnormality visualized.
Impression

Singleton intrauterine pregnancy at 32 weeks 0 days with
fetal cardiac activity
Cephalic presentation
AFI >18 cm
Recommendations

Continue serial antenatal assessment

## 2016-11-22 IMAGING — US US MFM FETAL BPP W/O NON-STRESS
1 series · 14 of 14 positions shown · non-contrast
Comparison: none

[Series 1: us mfm fetal bpp w/o non-stress · 14 acquisitions, 14 frames shown]
[im 1/14]
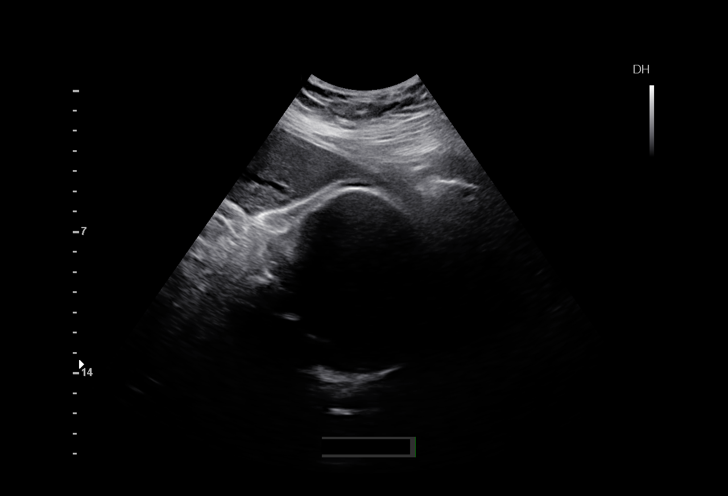
[im 2/14]
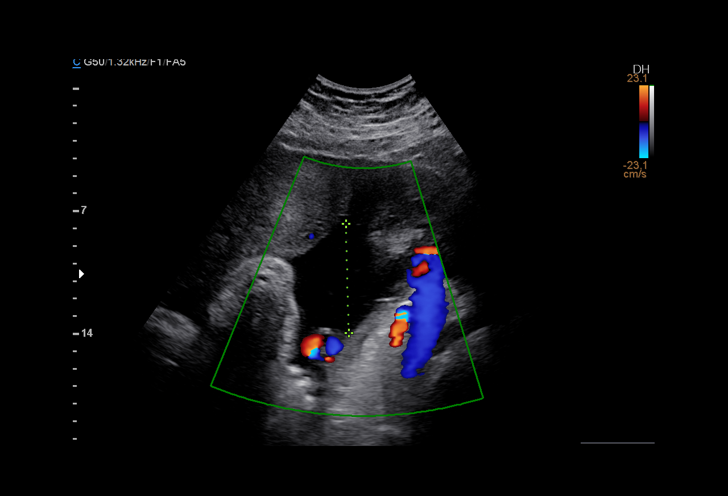
[im 3/14]
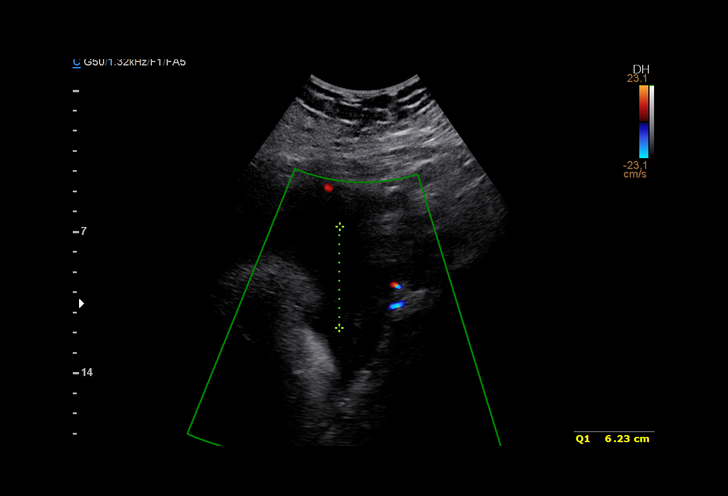
[im 4/14]
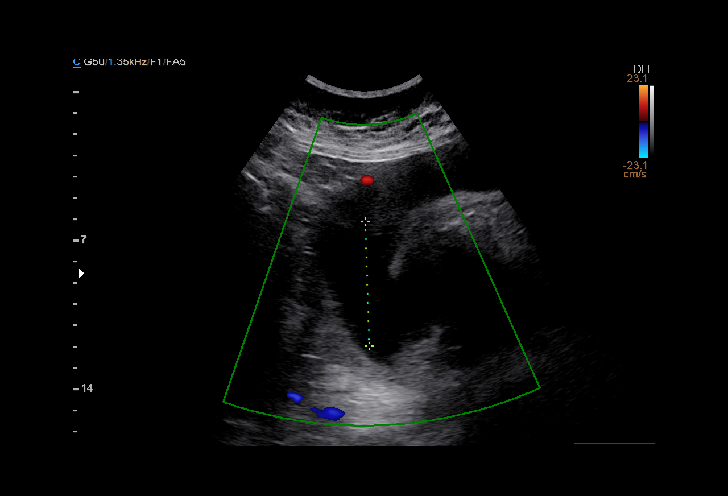
[im 5/14]
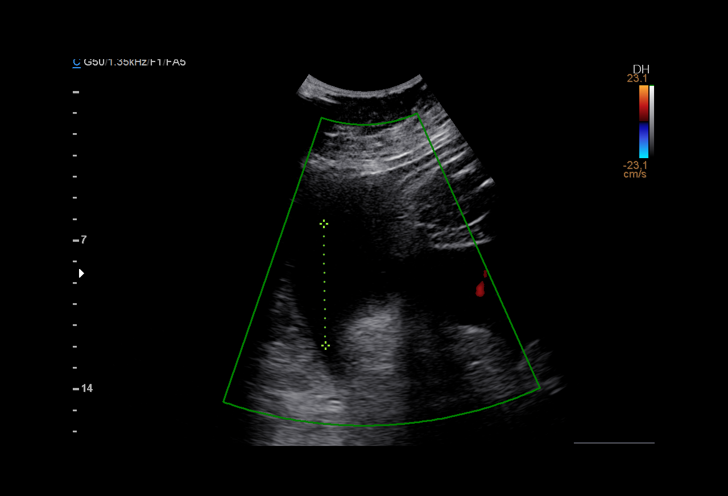
[im 6/14]
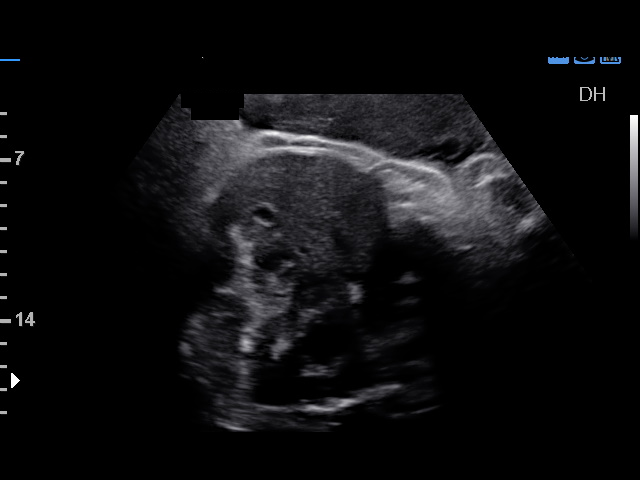
[im 7/14]
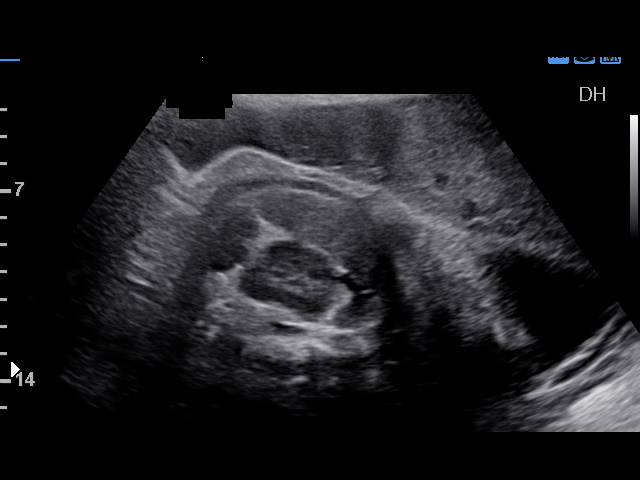
[im 8/14]
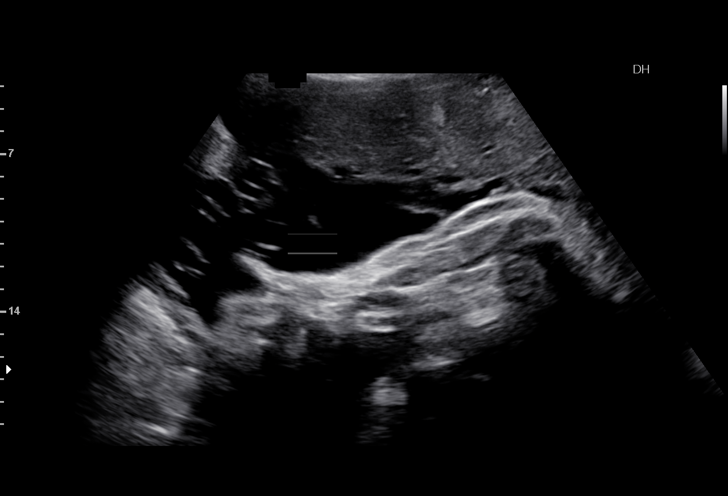
[im 9/14]
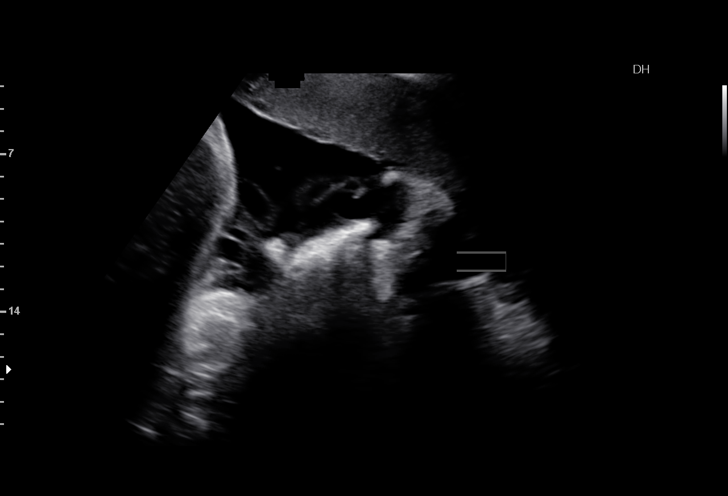
[im 10/14]
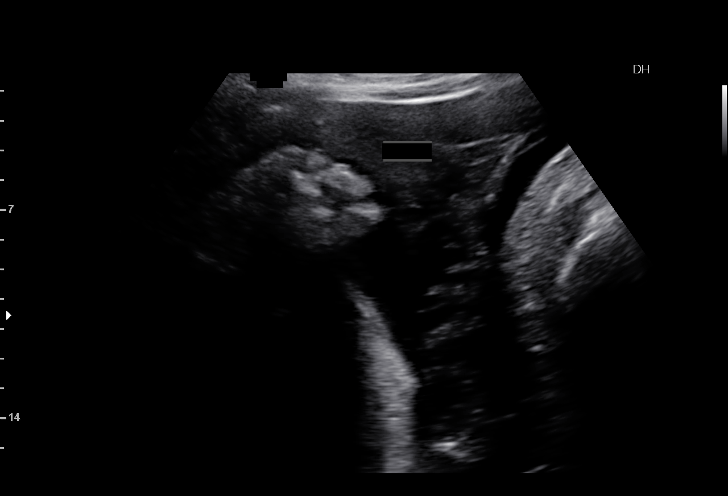
[im 11/14]
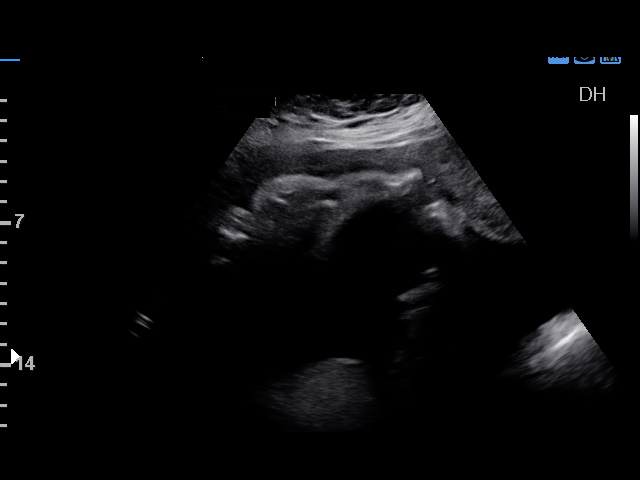
[im 12/14]
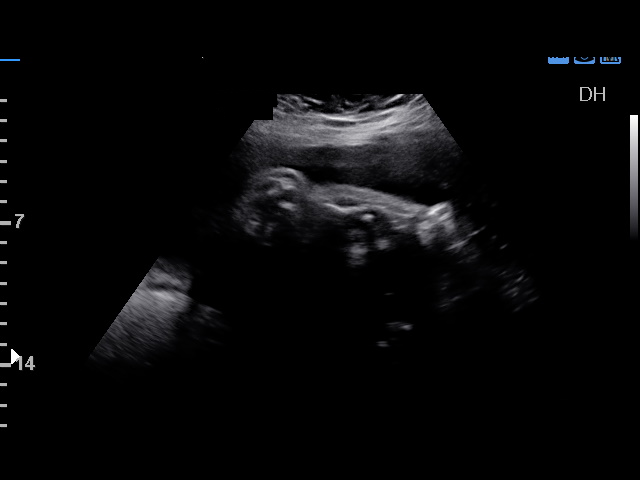
[im 13/14]
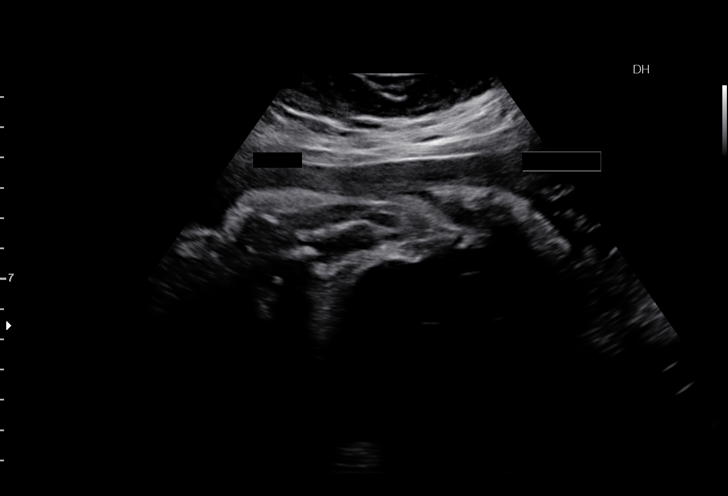
[im 14/14]
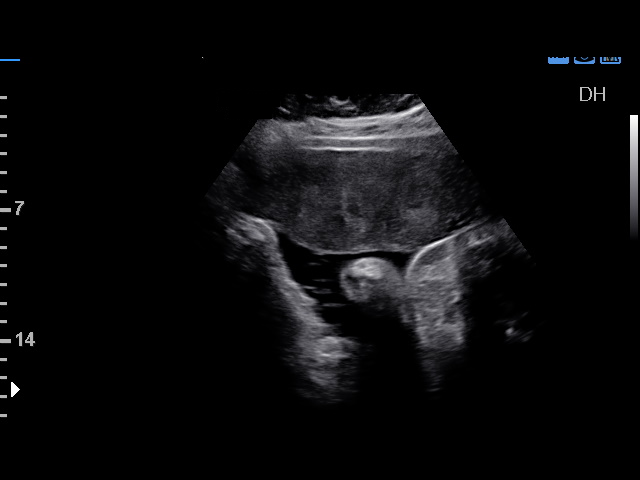

[14 of 14 positions shown; findings below may reference images not displayed]

OBSTETRICS REPORT

Service(s) Provided

Indications

38 weeks gestation of pregnancy
Diabetes - Pregestational,3rd trimester (glyburide,
metformin) - normal Fetal ECHO
Maternal morbid obesity, 664lbs, BMI - 47
Previous cesarean section
Decreased fetal movements, third trimester,
unspecified
Fetal Evaluation

Num Of Fetuses:    1
Fetal Heart Rate:  147                          bpm
Cardiac Activity:  Observed
Presentation:      Cephalic
Placenta:          Anterior, above cervical os
P. Cord            Previously Visualized
Insertion:

Amniotic Fluid
AFI FV:      Subjectively within normal limits
AFI Sum:     22.83   cm       91  %Tile     Larg Pckt:    6.23  cm
RUQ:   6.23    cm   RLQ:    5.01   cm    LUQ:   5.86    cm   LLQ:    5.73   cm
Biophysical Evaluation

Amniotic F.V:   Pocket => 2 cm two         F. Tone:        Observed
planes
F. Movement:    Observed                   Score:          [DATE]
F. Breathing:   Observed
Gestational Age

LMP:           38w 0d        Date:  12/22/14                 EDD:   09/28/15
Best:          38w 0d     Det. By:  LMP  (12/22/14)          EDD:   09/28/15
Impression

SIUP at 28w3d
BPP [DATE]
AFI is normal
Recommendations

Strict fetal Bambucafe Tarla several times daily (return for <10
kicks in 2 hrs).  Otherwise, delivery by 39 weeks given risk
factors.
I would state that if this patient returns for decreased fetal
movement, I would have a low threshold for delivery.  She
endorsed good fetal movement during ultrasound and during
discussion of BPP [DATE] score having a low risk for stillbirth of

3-5/4444.  She understands less than 10 perceived kicks in
a 2 hour Mikhael Glasper should prompt return immediately for
evaluation.  I recommended that she do Bambucafe Tarla several
times daily until delivery.

questions or concerns.

## 2017-10-02 ENCOUNTER — Encounter (HOSPITAL_COMMUNITY): Payer: Self-pay | Admitting: *Deleted

## 2017-10-02 ENCOUNTER — Emergency Department (HOSPITAL_COMMUNITY)
Admission: EM | Admit: 2017-10-02 | Discharge: 2017-10-02 | Disposition: A | Discharge status: Home / Self Care | Payer: BC Managed Care – PPO | Attending: Emergency Medicine | Admitting: Emergency Medicine

## 2017-10-02 DIAGNOSIS — Z79899 Other long term (current) drug therapy: Secondary | ICD-10-CM | POA: Insufficient documentation

## 2017-10-02 DIAGNOSIS — S4992XA Unspecified injury of left shoulder and upper arm, initial encounter: Secondary | ICD-10-CM | POA: Insufficient documentation

## 2017-10-02 DIAGNOSIS — Y929 Unspecified place or not applicable: Secondary | ICD-10-CM | POA: Diagnosis not present

## 2017-10-02 DIAGNOSIS — Y939 Activity, unspecified: Secondary | ICD-10-CM | POA: Diagnosis not present

## 2017-10-02 DIAGNOSIS — M545 Low back pain, unspecified: Secondary | ICD-10-CM

## 2017-10-02 DIAGNOSIS — Y999 Unspecified external cause status: Secondary | ICD-10-CM | POA: Insufficient documentation

## 2017-10-02 DIAGNOSIS — M62838 Other muscle spasm: Secondary | ICD-10-CM | POA: Diagnosis not present

## 2017-10-02 DIAGNOSIS — E119 Type 2 diabetes mellitus without complications: Secondary | ICD-10-CM | POA: Diagnosis not present

## 2017-10-02 DIAGNOSIS — S3992XA Unspecified injury of lower back, initial encounter: Secondary | ICD-10-CM | POA: Diagnosis present

## 2017-10-02 DIAGNOSIS — J45909 Unspecified asthma, uncomplicated: Secondary | ICD-10-CM | POA: Diagnosis not present

## 2017-10-02 DIAGNOSIS — Z7984 Long term (current) use of oral hypoglycemic drugs: Secondary | ICD-10-CM | POA: Diagnosis not present

## 2017-10-02 HISTORY — DX: Obesity, unspecified: E66.9

## 2017-10-02 MED ORDER — CYCLOBENZAPRINE HCL 10 MG PO TABS: 10.0000 mg | ORAL_TABLET | Freq: Every evening | ORAL | 0 refills | Status: AC | PRN
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
## 2017-10-02 NOTE — Discharge Instructions (Signed)
Please read instructions below.  You can take advil/ibuprofen every 6 hours as needed for pain.  You can take flexeril at bedtime as needed for muscle spasm. Drink plenty of water Apply ice to your back for 20 minutes at a time. You can also apply heat if this provides you with more relief. Return to ER if new numbness or tingling in your arms or legs, inability to urinate, inability to hold your bowels, or headache, vision changes, vomiting, or weakness in your extremities.

## 2020-09-23 ENCOUNTER — Encounter (HOSPITAL_COMMUNITY): Payer: Self-pay | Admitting: Emergency Medicine

## 2020-09-23 ENCOUNTER — Emergency Department (HOSPITAL_COMMUNITY): Payer: BC Managed Care – PPO

## 2020-09-23 ENCOUNTER — Other Ambulatory Visit: Payer: Self-pay

## 2020-09-23 ENCOUNTER — Emergency Department (HOSPITAL_COMMUNITY)
Admission: EM | Admit: 2020-09-23 | Discharge: 2020-09-23 | Disposition: A | Discharge status: Home / Self Care | Payer: BC Managed Care – PPO | Attending: Emergency Medicine | Admitting: Emergency Medicine

## 2020-09-23 DIAGNOSIS — Y9389 Activity, other specified: Secondary | ICD-10-CM | POA: Insufficient documentation

## 2020-09-23 DIAGNOSIS — S99921A Unspecified injury of right foot, initial encounter: Secondary | ICD-10-CM | POA: Diagnosis present

## 2020-09-23 DIAGNOSIS — Z87891 Personal history of nicotine dependence: Secondary | ICD-10-CM | POA: Diagnosis not present

## 2020-09-23 DIAGNOSIS — I10 Essential (primary) hypertension: Secondary | ICD-10-CM | POA: Insufficient documentation

## 2020-09-23 DIAGNOSIS — Z7984 Long term (current) use of oral hypoglycemic drugs: Secondary | ICD-10-CM | POA: Insufficient documentation

## 2020-09-23 DIAGNOSIS — S9031XA Contusion of right foot, initial encounter: Secondary | ICD-10-CM

## 2020-09-23 DIAGNOSIS — Y92410 Unspecified street and highway as the place of occurrence of the external cause: Secondary | ICD-10-CM | POA: Diagnosis not present

## 2020-09-23 DIAGNOSIS — E119 Type 2 diabetes mellitus without complications: Secondary | ICD-10-CM | POA: Diagnosis not present

## 2020-09-23 DIAGNOSIS — J45909 Unspecified asthma, uncomplicated: Secondary | ICD-10-CM | POA: Diagnosis not present

## 2020-09-23 HISTORY — DX: Essential (primary) hypertension: I10

## 2020-09-23 HISTORY — DX: Pure hypercholesterolemia, unspecified: E78.00
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
## 2020-09-23 NOTE — Discharge Instructions (Addendum)
You may have a bad ankle sprain and likely have a hematoma, or collection of blood in your foot. I advised that you apply ice to this 10 minutes at a time at home. Try to keep your foot elevated whenever possible at home. I gave you a boot to wear only when you are walking. When you are at home resting or in bed please take the boot off. I also gave you crutches for comfort. You can take Motrin and Tylenol at home for pain. If you continue having pain in 1 week, please schedule an appointment with orthopedic doctor as a follow-up.
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
## 2020-09-23 NOTE — Progress Notes (Signed)
Orthopedic Tech Progress Note Patient Details:  April Chan 1981/05/04 [id number]  Ortho Devices Type of Ortho Device: CAM walker, Crutches Ortho Device/Splint Location: Right Lower Extremity Ortho Device/Splint Interventions: Ordered, Application, Adjustment   Post Interventions Patient Tolerated: Well Instructions Provided: Adjustment of device, Care of device, Poper ambulation with device   Gerald Stabs 09/23/2020, 6:37 PM

## 2020-10-03 ENCOUNTER — Ambulatory Visit: Payer: BC Managed Care – PPO | Admitting: Podiatry

## 2020-10-09 ENCOUNTER — Ambulatory Visit: Payer: BC Managed Care – PPO | Admitting: Podiatrist

## 2021-08-22 ENCOUNTER — Emergency Department (HOSPITAL_COMMUNITY)
Admission: EM | Admit: 2021-08-22 | Discharge: 2021-08-23 | Disposition: A | Discharge status: Home / Self Care | Payer: BC Managed Care – PPO | Attending: Emergency Medicine | Admitting: Emergency Medicine

## 2021-08-22 ENCOUNTER — Encounter (HOSPITAL_COMMUNITY): Payer: Self-pay | Admitting: Emergency Medicine

## 2021-08-22 ENCOUNTER — Other Ambulatory Visit: Payer: Self-pay

## 2021-08-22 ENCOUNTER — Emergency Department (HOSPITAL_COMMUNITY): Payer: BC Managed Care – PPO

## 2021-08-22 DIAGNOSIS — M546 Pain in thoracic spine: Secondary | ICD-10-CM | POA: Diagnosis not present

## 2021-08-22 DIAGNOSIS — J45909 Unspecified asthma, uncomplicated: Secondary | ICD-10-CM | POA: Diagnosis not present

## 2021-08-22 DIAGNOSIS — M25522 Pain in left elbow: Secondary | ICD-10-CM | POA: Insufficient documentation

## 2021-08-22 DIAGNOSIS — S0993XA Unspecified injury of face, initial encounter: Secondary | ICD-10-CM | POA: Diagnosis present

## 2021-08-22 DIAGNOSIS — Z87891 Personal history of nicotine dependence: Secondary | ICD-10-CM | POA: Insufficient documentation

## 2021-08-22 DIAGNOSIS — M25521 Pain in right elbow: Secondary | ICD-10-CM | POA: Insufficient documentation

## 2021-08-22 DIAGNOSIS — N9489 Other specified conditions associated with female genital organs and menstrual cycle: Secondary | ICD-10-CM | POA: Diagnosis not present

## 2021-08-22 DIAGNOSIS — S0083XA Contusion of other part of head, initial encounter: Secondary | ICD-10-CM | POA: Insufficient documentation

## 2021-08-22 DIAGNOSIS — I10 Essential (primary) hypertension: Secondary | ICD-10-CM | POA: Insufficient documentation

## 2021-08-22 DIAGNOSIS — R000 Tachycardia, unspecified: Secondary | ICD-10-CM | POA: Diagnosis not present

## 2021-08-22 DIAGNOSIS — Z79899 Other long term (current) drug therapy: Secondary | ICD-10-CM | POA: Insufficient documentation

## 2021-08-22 DIAGNOSIS — E119 Type 2 diabetes mellitus without complications: Secondary | ICD-10-CM | POA: Diagnosis not present

## 2021-08-22 DIAGNOSIS — Z7984 Long term (current) use of oral hypoglycemic drugs: Secondary | ICD-10-CM | POA: Diagnosis not present

## 2021-08-22 DIAGNOSIS — Z23 Encounter for immunization: Secondary | ICD-10-CM | POA: Diagnosis not present

## 2021-08-22 MED ORDER — HYDROCODONE-ACETAMINOPHEN 5-325 MG PO TABS
2.0000 | ORAL_TABLET | Freq: Once | ORAL | Status: AC
  Administered 2021-08-22: 2 via ORAL
  Filled 2021-08-22: qty 2
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
## 2021-08-23 ENCOUNTER — Emergency Department (HOSPITAL_COMMUNITY): Payer: BC Managed Care – PPO

## 2021-08-23 LAB — CBG MONITORING, ED: Glucose-Capillary: 187 mg/dL — ABNORMAL HIGH (ref 70–99)

## 2021-08-23 LAB — CBC
HCT: 39.8 % (ref 36.0–46.0)
Hemoglobin: 13.2 g/dL (ref 12.0–15.0)
MCH: 27 pg (ref 26.0–34.0)
MCHC: 33.2 g/dL (ref 30.0–36.0)
MCV: 81.6 fL (ref 80.0–100.0)
Platelets: 234 10*3/uL (ref 150–400)
RBC: 4.88 MIL/uL (ref 3.87–5.11)
RDW: 13.5 % (ref 11.5–15.5)
WBC: 8.3 10*3/uL (ref 4.0–10.5)
nRBC: 0 % (ref 0.0–0.2)

## 2021-08-23 LAB — BASIC METABOLIC PANEL
Anion gap: 15 (ref 5–15)
BUN: 9 mg/dL (ref 6–20)
CO2: 20 mmol/L — ABNORMAL LOW (ref 22–32)
Calcium: 8.8 mg/dL — ABNORMAL LOW (ref 8.9–10.3)
Chloride: 101 mmol/L (ref 98–111)
Creatinine, Ser: 0.46 mg/dL (ref 0.44–1.00)
GFR, Estimated: >60 mL/min (ref >60)
Glucose, Bld: 252 mg/dL — ABNORMAL HIGH (ref 70–99)
Potassium: 3.4 mmol/L — ABNORMAL LOW (ref 3.5–5.1)
Sodium: 136 mmol/L (ref 135–145)

## 2021-08-23 LAB — I-STAT BETA HCG BLOOD, ED (MC, WL, AP ONLY): I-stat hCG, quantitative: <5 m[IU]/mL (ref <5)

## 2021-08-23 MED ORDER — METHOCARBAMOL 1000 MG/10ML IJ SOLN
500.0000 mg | Freq: Once | INTRAVENOUS | Status: AC
  Administered 2021-08-23: 500 mg via INTRAVENOUS
  Filled 2021-08-23: qty 500

## 2021-08-23 MED ORDER — MORPHINE SULFATE (PF) 4 MG/ML IV SOLN
4.0000 mg | Freq: Once | INTRAVENOUS | Status: AC
  Administered 2021-08-23: 4 mg via INTRAVENOUS
  Filled 2021-08-23: qty 1

## 2021-08-23 MED ORDER — IBUPROFEN 600 MG PO TABS: 600.0000 mg | ORAL_TABLET | Freq: Four times a day (QID) | ORAL | 0 refills | Status: AC | PRN

## 2021-08-23 MED ORDER — TETANUS-DIPHTH-ACELL PERTUSSIS 5-2.5-18.5 LF-MCG/0.5 IM SUSY
0.5000 mL | PREFILLED_SYRINGE | Freq: Once | INTRAMUSCULAR | Status: AC
  Administered 2021-08-23: 0.5 mL via INTRAMUSCULAR
  Filled 2021-08-23: qty 0.5

## 2021-08-23 MED ORDER — IOHEXOL 350 MG/ML SOLN
80.0000 mL | Freq: Once | INTRAVENOUS | Status: AC | PRN
  Administered 2021-08-23: 80 mL via INTRAVENOUS

## 2021-08-23 MED ORDER — METHOCARBAMOL 500 MG PO TABS: 500.0000 mg | ORAL_TABLET | Freq: Two times a day (BID) | ORAL | 0 refills | Status: AC

## 2021-08-23 MED ORDER — BACITRACIN ZINC 500 UNIT/GM EX OINT: 1.0000 "application " | TOPICAL_OINTMENT | Freq: Two times a day (BID) | CUTANEOUS | 0 refills | Status: AC

## 2021-08-23 NOTE — Discharge Instructions (Signed)
1. Medications: bacitracin for wounds, ibuprofen and robaxin for muscle soreness, usual home medications 2. Treatment: rest, drink plenty of fluids, gentle stretching 3. Follow Up: Please followup with your primary doctor in 2-3 days for discussion of your diagnoses and further evaluation after today's visit; if you do not have a primary care doctor use the resource guide provided to find one; Please return to the ER for new or worsening symptoms

## 2021-08-23 NOTE — ED Notes (Signed)
[facility] arrived to pick up patient
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
## 2022-03-18 ENCOUNTER — Emergency Department (HOSPITAL_BASED_OUTPATIENT_CLINIC_OR_DEPARTMENT_OTHER): Payer: BC Managed Care – PPO

## 2022-03-18 ENCOUNTER — Encounter (HOSPITAL_BASED_OUTPATIENT_CLINIC_OR_DEPARTMENT_OTHER): Payer: Self-pay | Admitting: Emergency Medicine

## 2022-03-18 ENCOUNTER — Other Ambulatory Visit: Payer: Self-pay

## 2022-03-18 ENCOUNTER — Emergency Department (HOSPITAL_BASED_OUTPATIENT_CLINIC_OR_DEPARTMENT_OTHER)
Admission: EM | Admit: 2022-03-18 | Discharge: 2022-03-19 | Disposition: A | Discharge status: Home / Self Care | Payer: BC Managed Care – PPO | Attending: Emergency Medicine | Admitting: Emergency Medicine

## 2022-03-18 DIAGNOSIS — E871 Hypo-osmolality and hyponatremia: Secondary | ICD-10-CM

## 2022-03-18 DIAGNOSIS — I10 Essential (primary) hypertension: Secondary | ICD-10-CM | POA: Diagnosis not present

## 2022-03-18 DIAGNOSIS — Z20822 Contact with and (suspected) exposure to covid-19: Secondary | ICD-10-CM | POA: Diagnosis not present

## 2022-03-18 DIAGNOSIS — R109 Unspecified abdominal pain: Secondary | ICD-10-CM | POA: Diagnosis present

## 2022-03-18 DIAGNOSIS — R112 Nausea with vomiting, unspecified: Secondary | ICD-10-CM | POA: Insufficient documentation

## 2022-03-18 DIAGNOSIS — Z7984 Long term (current) use of oral hypoglycemic drugs: Secondary | ICD-10-CM | POA: Diagnosis not present

## 2022-03-18 DIAGNOSIS — D72829 Elevated white blood cell count, unspecified: Secondary | ICD-10-CM | POA: Insufficient documentation

## 2022-03-18 DIAGNOSIS — R1031 Right lower quadrant pain: Secondary | ICD-10-CM | POA: Insufficient documentation

## 2022-03-18 DIAGNOSIS — R509 Fever, unspecified: Secondary | ICD-10-CM | POA: Insufficient documentation

## 2022-03-18 DIAGNOSIS — E1165 Type 2 diabetes mellitus with hyperglycemia: Secondary | ICD-10-CM | POA: Diagnosis not present

## 2022-03-18 DIAGNOSIS — R7401 Elevation of levels of liver transaminase levels: Secondary | ICD-10-CM

## 2022-03-18 LAB — COMPREHENSIVE METABOLIC PANEL
ALT: 28 U/L (ref 0–44)
AST: 42 U/L — ABNORMAL HIGH (ref 15–41)
Albumin: 4.3 g/dL (ref 3.5–5.0)
Alkaline Phosphatase: 62 U/L (ref 38–126)
Anion gap: 14 (ref 5–15)
BUN: 8 mg/dL (ref 6–20)
CO2: 21 mmol/L — ABNORMAL LOW (ref 22–32)
Calcium: 8.8 mg/dL — ABNORMAL LOW (ref 8.9–10.3)
Chloride: 95 mmol/L — ABNORMAL LOW (ref 98–111)
Creatinine, Ser: 0.71 mg/dL (ref 0.44–1.00)
GFR, Estimated: >60 mL/min (ref >60)
Glucose, Bld: 323 mg/dL — ABNORMAL HIGH (ref 70–99)
Potassium: 5.1 mmol/L (ref 3.5–5.1)
Sodium: 130 mmol/L — ABNORMAL LOW (ref 135–145)
Total Bilirubin: 0.9 mg/dL (ref 0.3–1.2)
Total Protein: 7.5 g/dL (ref 6.5–8.1)

## 2022-03-18 LAB — CBC
HCT: 42.3 % (ref 36.0–46.0)
Hemoglobin: 14.1 g/dL (ref 12.0–15.0)
MCH: 27.2 pg (ref 26.0–34.0)
MCHC: 33.3 g/dL (ref 30.0–36.0)
MCV: 81.7 fL (ref 80.0–100.0)
Platelets: 185 10*3/uL (ref 150–400)
RBC: 5.18 MIL/uL — ABNORMAL HIGH (ref 3.87–5.11)
RDW: 13.4 % (ref 11.5–15.5)
WBC: 10.2 10*3/uL (ref 4.0–10.5)
nRBC: 0 % (ref 0.0–0.2)

## 2022-03-18 LAB — CBC WITH DIFFERENTIAL/PLATELET
Abs Immature Granulocytes: 0.07 10*3/uL (ref 0.00–0.07)
Basophils Absolute: 0.1 10*3/uL (ref 0.0–0.1)
Basophils Relative: 1 %
Eosinophils Absolute: 0 10*3/uL (ref 0.0–0.5)
Eosinophils Relative: 0 %
HCT: 38.8 % (ref 36.0–46.0)
Hemoglobin: 13.2 g/dL (ref 12.0–15.0)
Immature Granulocytes: 1 %
Lymphocytes Relative: 9 %
Lymphs Abs: 1 10*3/uL (ref 0.7–4.0)
MCH: 27.2 pg (ref 26.0–34.0)
MCHC: 34 g/dL (ref 30.0–36.0)
MCV: 80 fL (ref 80.0–100.0)
Monocytes Absolute: 1.1 10*3/uL — ABNORMAL HIGH (ref 0.1–1.0)
Monocytes Relative: 9 %
Neutro Abs: 9.8 10*3/uL — ABNORMAL HIGH (ref 1.7–7.7)
Neutrophils Relative %: 80 %
Platelets: 162 10*3/uL (ref 150–400)
RBC: 4.85 MIL/uL (ref 3.87–5.11)
RDW: 13.2 % (ref 11.5–15.5)
WBC: 12 10*3/uL — ABNORMAL HIGH (ref 4.0–10.5)
nRBC: 0 % (ref 0.0–0.2)

## 2022-03-18 LAB — LIPASE, BLOOD: Lipase: 28 U/L (ref 11–51)

## 2022-03-18 LAB — URINALYSIS, ROUTINE W REFLEX MICROSCOPIC
Bilirubin Urine: NEGATIVE
Glucose, UA: >1000 mg/dL — AB
Hgb urine dipstick: NEGATIVE
Ketones, ur: >80 mg/dL — AB
Leukocytes,Ua: NEGATIVE
Nitrite: NEGATIVE
Specific Gravity, Urine: >1.046 — ABNORMAL HIGH (ref 1.005–1.030)
pH: 6 (ref 5.0–8.0)

## 2022-03-18 LAB — LACTIC ACID, PLASMA: Lactic Acid, Venous: 1.7 mmol/L (ref 0.5–1.9)

## 2022-03-18 LAB — RESP PANEL BY RT-PCR (FLU A&B, COVID) ARPGX2
Influenza A by PCR: NEGATIVE
Influenza B by PCR: NEGATIVE
SARS Coronavirus 2 by RT PCR: NEGATIVE

## 2022-03-18 LAB — PROTIME-INR
INR: 1 (ref 0.8–1.2)
Prothrombin Time: 13.4 seconds (ref 11.4–15.2)

## 2022-03-18 LAB — MONONUCLEOSIS SCREEN: Mono Screen: NEGATIVE

## 2022-03-18 LAB — APTT: aPTT: 24 seconds (ref 24–36)

## 2022-03-18 LAB — HCG, SERUM, QUALITATIVE: Preg, Serum: NEGATIVE

## 2022-03-18 MED ORDER — KETOROLAC TROMETHAMINE 15 MG/ML IJ SOLN
15.0000 mg | Freq: Once | INTRAMUSCULAR | Status: AC
  Administered 2022-03-18: 15 mg via INTRAVENOUS
  Filled 2022-03-18: qty 1

## 2022-03-18 MED ORDER — HYDROMORPHONE HCL 1 MG/ML IJ SOLN
1.0000 mg | Freq: Once | INTRAMUSCULAR | Status: AC
  Administered 2022-03-18: 1 mg via INTRAVENOUS
  Filled 2022-03-18: qty 1

## 2022-03-18 MED ORDER — LACTATED RINGERS IV SOLN: INTRAVENOUS | Status: DC

## 2022-03-18 MED ORDER — CEFTRIAXONE SODIUM 2 G IJ SOLR
2.0000 g | Freq: Once | INTRAMUSCULAR | Status: AC
  Administered 2022-03-18: 2 g via INTRAVENOUS
  Filled 2022-03-18: qty 20

## 2022-03-18 MED ORDER — ONDANSETRON HCL 4 MG/2ML IJ SOLN
4.0000 mg | Freq: Once | INTRAMUSCULAR | Status: AC
  Administered 2022-03-18: 4 mg via INTRAVENOUS
  Filled 2022-03-18: qty 2

## 2022-03-18 MED ORDER — METRONIDAZOLE 500 MG/100ML IV SOLN
500.0000 mg | Freq: Once | INTRAVENOUS | Status: AC
  Administered 2022-03-18: 500 mg via INTRAVENOUS
  Filled 2022-03-18: qty 100

## 2022-03-18 MED ORDER — IOHEXOL 350 MG/ML SOLN
100.0000 mL | Freq: Once | INTRAVENOUS | Status: AC | PRN
  Administered 2022-03-18: 100 mL via INTRAVENOUS

## 2022-03-18 MED ORDER — SODIUM CHLORIDE 0.9 % IV BOLUS
1000.0000 mL | Freq: Once | INTRAVENOUS | Status: AC
  Administered 2022-03-18: 1000 mL via INTRAVENOUS

## 2022-03-18 MED ORDER — ACETAMINOPHEN 325 MG PO TABS
650.0000 mg | ORAL_TABLET | Freq: Once | ORAL | Status: AC
  Administered 2022-03-18: 650 mg via ORAL
  Filled 2022-03-18: qty 2

## 2022-03-18 MED ORDER — MORPHINE SULFATE (PF) 4 MG/ML IV SOLN
4.0000 mg | Freq: Once | INTRAVENOUS | Status: AC
Start: 2022-03-18 — End: 2022-03-18
  Administered 2022-03-18: 4 mg via INTRAVENOUS
  Filled 2022-03-18: qty 1

## 2022-03-18 MED ORDER — FENTANYL CITRATE PF 50 MCG/ML IJ SOSY
50.0000 ug | PREFILLED_SYRINGE | Freq: Once | INTRAMUSCULAR | Status: AC
  Administered 2022-03-18: 50 ug via INTRAVENOUS
  Filled 2022-03-18: qty 1

## 2022-03-18 MED ORDER — LACTATED RINGERS IV BOLUS (SEPSIS)
1000.0000 mL | Freq: Once | INTRAVENOUS | Status: AC
  Administered 2022-03-18: 1000 mL via INTRAVENOUS

## 2022-03-18 NOTE — ED Triage Notes (Signed)
Pt arrived via [facility] from home. Per EMS, pt c/o RLQ abd pain radiating to the back with nausea onset yesterday with episode of vomiting today. Pt caox4 and ambulatory with EMS. Hx diabetes, hyperglycemic with EMS at 361. Pt did not take metformin today d/t n/v. ? ?VS  ?154/98 BP ?96 HR ?Cbg 361 ? ?

## 2022-03-18 NOTE — ED Provider Notes (Signed)
?[facility] EMERGENCY DEPT ?Provider Note ? ? ?CSN: [id number] ?Arrival date & time: 03/18/22  1627 ? ?  ? ?History ? ?Chief Complaint  ?Patient presents with  ? Abdominal Pain  ? ? ?April Chan is a 41 y.o. female.  With past medical history of type 2 diabetes, obesity, GERD who presents to the emergency department with abdominal pain. ? ?States that beginning yesterday she has had constant, dull abdominal pain that she states progresses to short episodes of sharp pain.  States that the pain is on the right side and will radiate from her right back/flank to her right abdomen.  She states she has had associated chills, nausea and vomiting.  She denies diarrhea, hematemesis, melena, hematochezia, objective fever.  Denies dysuria, vaginal discharge.  Last menstrual period was last week. ? ? ?Abdominal Pain ?Associated symptoms: chills, nausea and vomiting   ?Associated symptoms: no chest pain, no diarrhea, no dysuria, no fever, no shortness of breath and no vaginal discharge   ? ?  ? ?Home Medications ?Prior to Admission medications   ?Medication Sig Start Date End Date Taking? Authorizing Provider  ?amoxicillin (AMOXIL) 500 MG capsule Take 1 capsule (500 mg total) by mouth 3 (three) times daily. 02/12/16   Tharon Aquas, PA  ?bacitracin ointment Apply 1 application topically 2 (two) times daily. 08/23/21   Muthersbaugh, Dahlia Client, PA-C  ?BAYER CONTOUR TEST test strip TEST BLOOD SUGARS 4 TIMES DAILY 02/08/15   Brock Bad, MD  ?cyclobenzaprine (FLEXERIL) 10 MG tablet Take 1 tablet (10 mg total) by mouth at bedtime as needed for muscle spasms. 10/02/17   Robinson, [place] N, PA-C  ?glyBURIDE (DIABETA) 2.5 MG tablet Take 2 tablets (5 mg total) by mouth 2 (two) times daily with a meal. ?Patient not taking: Reported on 11/01/2015 09/05/15   Levie Heritage, DO  ?ibuprofen (ADVIL) 600 MG tablet Take 1 tablet (600 mg total) by mouth every 6 (six) hours as needed. 08/23/21   Muthersbaugh, Dahlia Client, PA-C   ?metFORMIN (GLUCOPHAGE) 500 MG tablet Take 1 tablet (500 mg total) by mouth 2 (two) times daily with a meal. ?Patient taking differently: Take 500 mg by mouth 2 (two) times daily with a meal. Two in the morning and one at night 06/28/15   Amedeo Gory, CNM  ?methocarbamol (ROBAXIN) 500 MG tablet Take 1 tablet (500 mg total) by mouth 2 (two) times daily. 08/23/21   Muthersbaugh, Dahlia Client, PA-C  ?metoCLOPramide (REGLAN) 10 MG tablet Take 1 tablet (10 mg total) by mouth 4 (four) times daily. ?Patient not taking: Reported on 11/01/2015 09/05/15   Levie Heritage, DO  ?omeprazole (PRILOSEC) 20 MG capsule TAKE 1 CAPSULE (20 MG TOTAL) BY MOUTH AT BEDTIME. 10/02/14   Brock Bad, MD  ?oxyCODONE-acetaminophen (PERCOCET/ROXICET) 5-325 MG tablet Take 2 tablets by mouth every 4 (four) hours as needed (for pain scale greater than 7). ?Patient not taking: Reported on 11/01/2015 10/01/15   Tereso Newcomer, MD  ?Prenatal Vit-Fe Fumarate-FA (VITAFOL-OB) TABS Take 1 tablet by mouth daily before breakfast. 07/17/14   Brock Bad, MD  ?sertraline (ZOLOFT) 50 MG tablet Take 1 tablet (50 mg total) by mouth daily. ?Patient not taking: Reported on 11/01/2015 09/24/15   Levie Heritage, DO  ?valACYclovir (VALTREX) 500 MG tablet TAKE 1 TABLET BY MOUTH EVERY DAY ?Patient not taking: Reported on 11/01/2015 09/01/15   Brock Bad, MD  ?   ? ?Allergies    ?Patient has no known allergies.   ? ?  Review of Systems   ?Review of Systems  ?Constitutional:  Positive for chills. Negative for fever.  ?Respiratory:  Negative for shortness of breath.   ?Cardiovascular:  Negative for chest pain.  ?Gastrointestinal:  Positive for abdominal pain, nausea and vomiting. Negative for blood in stool and diarrhea.  ?Genitourinary:  Negative for dysuria, menstrual problem and vaginal discharge.  ?Neurological:  Negative for headaches.  ?All other systems reviewed and are negative. ? ?Physical Exam ?Updated Vital Signs ?BP (!) 114/99   Pulse  91   Temp 99.2 ?F (37.3 ?C) (Oral)   Resp 16   Ht 5\' 7"  (1.702 m)   Wt 124.7 kg   LMP 03/14/2022   SpO2 99%   BMI 43.07 kg/m?  ?Physical Exam ?Vitals and nursing note reviewed.  ?Constitutional:   ?   General: She is in acute distress.  ?   Appearance: She is well-developed. She is obese. She is ill-appearing. She is not toxic-appearing.  ?HENT:  ?   Head: Normocephalic and atraumatic.  ?   Mouth/Throat:  ?   Mouth: Mucous membranes are moist.  ?   Pharynx: Oropharynx is clear.  ?Eyes:  ?   General: No scleral icterus. ?   Pupils: Pupils are equal, round, and reactive to light.  ?Cardiovascular:  ?   Rate and Rhythm: Normal rate and regular rhythm.  ?   Heart sounds: Normal heart sounds. No murmur heard. ?Pulmonary:  ?   Effort: Pulmonary effort is normal. No respiratory distress.  ?   Breath sounds: Normal breath sounds.  ?Abdominal:  ?   General: Abdomen is protuberant. Bowel sounds are decreased. There is no distension.  ?   Palpations: Abdomen is soft.  ?   Tenderness: There is generalized abdominal tenderness.  ?   Hernia: No hernia is present.  ?Skin: ?   General: Skin is warm and dry.  ?Neurological:  ?   Mental Status: She is alert.  ? ? ?ED Results / Procedures / Treatments   ?Labs ?(all labs ordered are listed, but only abnormal results are displayed) ?Labs Reviewed  ?COMPREHENSIVE METABOLIC PANEL - Abnormal; Notable for the following components:  ?    Result Value  ? Sodium 130 (*)   ? Chloride 95 (*)   ? CO2 21 (*)   ? Glucose, Bld 323 (*)   ? Calcium 8.8 (*)   ? AST 42 (*)   ? All other components within normal limits  ?CBC - Abnormal; Notable for the following components:  ? RBC 5.18 (*)   ? All other components within normal limits  ?URINALYSIS, ROUTINE W REFLEX MICROSCOPIC - Abnormal; Notable for the following components:  ? Specific Gravity, Urine >1.046 (*)   ? Glucose, UA >1,000 (*)   ? Ketones, ur >80 (*)   ? Protein, ur TRACE (*)   ? All other components within normal limits  ?CBC WITH  DIFFERENTIAL/PLATELET - Abnormal; Notable for the following components:  ? WBC 12.0 (*)   ? Neutro Abs 9.8 (*)   ? Monocytes Absolute 1.1 (*)   ? All other components within normal limits  ?CBG MONITORING, ED - Abnormal; Notable for the following components:  ? Glucose-Capillary 254 (*)   ? All other components within normal limits  ?RESP PANEL BY RT-PCR (FLU A&B, COVID) [id number]  ?CULTURE, BLOOD (ROUTINE X 2)  ?CULTURE, BLOOD (ROUTINE X 2)  ?LIPASE, BLOOD  ?HCG, SERUM, QUALITATIVE  ?MONONUCLEOSIS SCREEN  ?LACTIC ACID, PLASMA  ?PROTIME-INR  ?APTT  ? ?  EKG ?EKG Interpretation ? ?Date/Time:  Wednesday March 19 2022 00:12:40 EDT ?Ventricular Rate:  105 ?PR Interval:  146 ?QRS Duration: 91 ?QT Interval:  359 ?QTC Calculation: 475 ?R Axis:   18 ?Text Interpretation: Sinus tachycardia Borderline T wave abnormalities When compared with ECG of 04/03/2006, No significant change was found Confirmed by Dione BoozeGlick, David ([id number]) on 03/19/2022 12:34:32 AM ? ?Radiology ?CT ABDOMEN PELVIS W CONTRAST ? ?Result Date: 03/18/2022 ?CLINICAL DATA:  Abdominal pain, acute, nonlocalized EXAM: CT ABDOMEN AND PELVIS WITH CONTRAST TECHNIQUE: Multidetector CT imaging of the abdomen and pelvis was performed using the standard protocol following bolus administration of intravenous contrast. RADIATION DOSE REDUCTION: This exam was performed according to the departmental dose-optimization program which includes automated exposure control, adjustment of the mA and/or kV according to patient size and/or use of iterative reconstruction technique. CONTRAST:  75mL OMNIPAQUE IOHEXOL 350 MG/ML SOLN COMPARISON:  None. FINDINGS: Lower chest: No acute abnormality. Hepatobiliary: The liver is enlarged measuring up to 22.5 Cm. The hepatic parenchyma is diffusely hypodense compared to the splenic parenchyma consistent with fatty infiltration. No focal liver abnormality. No gallstones, gallbladder wall thickening, or pericholecystic fluid. No biliary dilatation. Pancreas:  No focal lesion. Normal pancreatic contour. No surrounding inflammatory changes. No main pancreatic ductal dilatation. Spleen: The spleen is borderline enlarged measuring up to 13 cm. No focal splenic lesion. Adrenals/U

## 2022-03-18 NOTE — ED Notes (Signed)
Patient transported to CT 

## 2022-03-18 NOTE — ED Notes (Signed)
Went in to dc pt, pt tearful, states "it's unethical to dc home with her in pain and now a fever" ?Reviewed VS and informed EDP. ?

## 2022-03-18 NOTE — Sepsis Progress Note (Signed)
Monitoring for the code sepsis protocol. °

## 2022-03-18 NOTE — ED Provider Notes (Signed)
Care assumed from Dr. Pearline Cables, patient with abdominal pain and negative CT scan but ongoing severe pain and now with fever getting sepsis work-up. ? ?Sepsis work-up is unremarkable.  Chest x-ray shows no evidence of pneumonia.  I have independently viewed the image, and agree with radiologist's interpretation.  Repeat CBC shows slight increase in WBC but without a left shift.  Lactic acid level is normal.  Urine shows no evidence of infection.  Patient was reevaluated and states that she is feeling much better.  On exam reexam, abdomen is soft with only minimal tenderness without any focal tenderness.  She is felt to be safe for discharge.  She is discharged with prescription for ondansetron and a small number of tramadol tablets for pain.  Given strict return precautions.  At this point, fever and abdominal pain seem most likely to be due to a viral infection, will not send home on antibiotics. ? ?Results for orders placed or performed during the hospital encounter of 03/18/22  ?Resp Panel by RT-PCR (Flu A&B, Covid) Nasopharyngeal Swab  ? Specimen: Nasopharyngeal Swab; Nasopharyngeal(NP) swabs in vial transport medium  ?Result Value Ref Range  ? SARS Coronavirus 2 by RT PCR NEGATIVE NEGATIVE  ? Influenza A by PCR NEGATIVE NEGATIVE  ? Influenza B by PCR NEGATIVE NEGATIVE  ?Lipase, blood  ?Result Value Ref Range  ? Lipase 28 11 - 51 U/L  ?Comprehensive metabolic panel  ?Result Value Ref Range  ? Sodium 130 (L) 135 - 145 mmol/L  ? Potassium 5.1 3.5 - 5.1 mmol/L  ? Chloride 95 (L) 98 - 111 mmol/L  ? CO2 21 (L) 22 - 32 mmol/L  ? Glucose, Bld 323 (H) 70 - 99 mg/dL  ? BUN 8 6 - 20 mg/dL  ? Creatinine, Ser 0.71 0.44 - 1.00 mg/dL  ? Calcium 8.8 (L) 8.9 - 10.3 mg/dL  ? Total Protein 7.5 6.5 - 8.1 g/dL  ? Albumin 4.3 3.5 - 5.0 g/dL  ? AST 42 (H) 15 - 41 U/L  ? ALT 28 0 - 44 U/L  ? Alkaline Phosphatase 62 38 - 126 U/L  ? Total Bilirubin 0.9 0.3 - 1.2 mg/dL  ? GFR, Estimated >60 >60 mL/min  ? Anion gap 14 5 - 15  ?CBC  ?Result  Value Ref Range  ? WBC 10.2 4.0 - 10.5 K/uL  ? RBC 5.18 (H) 3.87 - 5.11 MIL/uL  ? Hemoglobin 14.1 12.0 - 15.0 g/dL  ? HCT 42.3 36.0 - 46.0 %  ? MCV 81.7 80.0 - 100.0 fL  ? MCH 27.2 26.0 - 34.0 pg  ? MCHC 33.3 30.0 - 36.0 g/dL  ? RDW 13.4 11.5 - 15.5 %  ? Platelets 185 150 - 400 K/uL  ? nRBC 0.0 0.0 - 0.2 %  ?Urinalysis, Routine w reflex microscopic Urine, Clean Catch  ?Result Value Ref Range  ? Color, Urine YELLOW YELLOW  ? APPearance CLEAR CLEAR  ? Specific Gravity, Urine >1.046 (H) 1.005 - 1.030  ? pH 6.0 5.0 - 8.0  ? Glucose, UA >1,000 (A) NEGATIVE mg/dL  ? Hgb urine dipstick NEGATIVE NEGATIVE  ? Bilirubin Urine NEGATIVE NEGATIVE  ? Ketones, ur >80 (A) NEGATIVE mg/dL  ? Protein, ur TRACE (A) NEGATIVE mg/dL  ? Nitrite NEGATIVE NEGATIVE  ? Leukocytes,Ua NEGATIVE NEGATIVE  ? RBC / HPF 0-5 0 - 5 RBC/hpf  ? WBC, UA 0-5 0 - 5 WBC/hpf  ? Mucus PRESENT   ?hCG, serum, qualitative  ?Result Value Ref Range  ? Preg, Serum  NEGATIVE NEGATIVE  ?Mononucleosis screen  ?Result Value Ref Range  ? Mono Screen NEGATIVE NEGATIVE  ?Lactic acid, plasma  ?Result Value Ref Range  ? Lactic Acid, Venous 1.7 0.5 - 1.9 mmol/L  ?CBC WITH DIFFERENTIAL  ?Result Value Ref Range  ? WBC 12.0 (H) 4.0 - 10.5 K/uL  ? RBC 4.85 3.87 - 5.11 MIL/uL  ? Hemoglobin 13.2 12.0 - 15.0 g/dL  ? HCT 38.8 36.0 - 46.0 %  ? MCV 80.0 80.0 - 100.0 fL  ? MCH 27.2 26.0 - 34.0 pg  ? MCHC 34.0 30.0 - 36.0 g/dL  ? RDW 13.2 11.5 - 15.5 %  ? Platelets 162 150 - 400 K/uL  ? nRBC 0.0 0.0 - 0.2 %  ? Neutrophils Relative % 80 %  ? Neutro Abs 9.8 (H) 1.7 - 7.7 K/uL  ? Lymphocytes Relative 9 %  ? Lymphs Abs 1.0 0.7 - 4.0 K/uL  ? Monocytes Relative 9 %  ? Monocytes Absolute 1.1 (H) 0.1 - 1.0 K/uL  ? Eosinophils Relative 0 %  ? Eosinophils Absolute 0.0 0.0 - 0.5 K/uL  ? Basophils Relative 1 %  ? Basophils Absolute 0.1 0.0 - 0.1 K/uL  ? Immature Granulocytes 1 %  ? Abs Immature Granulocytes 0.07 0.00 - 0.07 K/uL  ?Protime-INR  ?Result Value Ref Range  ? Prothrombin Time 13.4 11.4 -  15.2 seconds  ? INR 1.0 0.8 - 1.2  ?APTT  ?Result Value Ref Range  ? aPTT 24 24 - 36 seconds  ?CBG monitoring, ED  ?Result Value Ref Range  ? Glucose-Capillary 254 (H) 70 - 99 mg/dL  ? ?CT ABDOMEN PELVIS W CONTRAST ? ?Result Date: 03/18/2022 ?CLINICAL DATA:  Abdominal pain, acute, nonlocalized EXAM: CT ABDOMEN AND PELVIS WITH CONTRAST TECHNIQUE: Multidetector CT imaging of the abdomen and pelvis was performed using the standard protocol following bolus administration of intravenous contrast. RADIATION DOSE REDUCTION: This exam was performed according to the departmental dose-optimization program which includes automated exposure control, adjustment of the mA and/or kV according to patient size and/or use of iterative reconstruction technique. CONTRAST:  62mL OMNIPAQUE IOHEXOL 350 MG/ML SOLN COMPARISON:  None. FINDINGS: Lower chest: No acute abnormality. Hepatobiliary: The liver is enlarged measuring up to 22.5 Cm. The hepatic parenchyma is diffusely hypodense compared to the splenic parenchyma consistent with fatty infiltration. No focal liver abnormality. No gallstones, gallbladder wall thickening, or pericholecystic fluid. No biliary dilatation. Pancreas: No focal lesion. Normal pancreatic contour. No surrounding inflammatory changes. No main pancreatic ductal dilatation. Spleen: The spleen is borderline enlarged measuring up to 13 cm. No focal splenic lesion. Adrenals/Urinary Tract: No adrenal nodule bilaterally. Bilateral kidneys enhance symmetrically. No hydronephrosis. No hydroureter. The urinary bladder is unremarkable. Stomach/Bowel: Stomach is within normal limits. No evidence of bowel wall thickening or dilatation. Appendix appears normal. Vascular/Lymphatic: No abdominal aorta or iliac aneurysm. No abdominal, pelvic, or inguinal lymphadenopathy. Reproductive: Bilateral tubal ligation. Uterus and bilateral adnexa are unremarkable. Other: No intraperitoneal free fluid. No intraperitoneal free gas. No  organized fluid collection. Musculoskeletal: No abdominal wall hernia or abnormality. No suspicious lytic or blastic osseous lesions. No acute displaced fracture. IMPRESSION: 1. Hepatic steatosis. 2. Hepatosplenomegaly. 3. Otherwise no acute intra-abdominal or intrapelvic abnormality. Electronically Signed   By: Iven Finn M.D.   On: 03/18/2022 18:15  ? ?DG Chest Port 1 View ? ?Result Date: 03/18/2022 ?CLINICAL DATA:  Right lower quadrant abdominal pain and nausea. EXAM: PORTABLE CHEST 1 VIEW COMPARISON:  June 15, 2006 FINDINGS: The heart size and mediastinal  contours are within normal limits. Both lungs are clear. The visualized skeletal structures are unremarkable. IMPRESSION: No active disease. Electronically Signed   By: Virgina Norfolk M.D.   On: 03/18/2022 23:28   ?  ?Delora Fuel, MD ?[id number] ? ?

## 2022-03-18 NOTE — ED Notes (Signed)
Pt is aware a urine has been ordered, pt states she is unable to void right now  ?

## 2022-03-18 NOTE — Discharge Instructions (Addendum)
You are seen in the emergency department today for abdominal pain.  Your work-up here has been reassuring and there are no emergencies that we can see on your work-up.  Please continue to take Tylenol and Motrin as needed for your symptoms.  You may take tramadol for pain not relieved by Tylenol and Motrin.  Please return to the emergency department if you begin having nausea and vomiting that you cannot control and begin to have inability to tolerate liquids.  Otherwise please follow-up with your primary care provider. ?

## 2022-03-18 NOTE — ED Provider Notes (Signed)
41 year old female here today with right lower quad abdominal pain/right flank pain. Onset of pain yesterday, a/w 1x emesis. Nausea. No fevers but has been having some chills. No change to bowel/bladder function. No trauma. ? ?She is in significant discomfort on initial evaluation, pain did improve greatly after IV analgesics. Upon re-assessment pt found to have fever 102. Her initial cbc was without leukocytosis and CT a/p was negative for an acute process. UA did not demonstrate an infection and flu/covid/mono test was negative. Given fever, persistent tachycardia and pain will proceed with sepsis workup. Unclear source but possible intra-abdominal although source not identified on imaging or workup thus far. Collect cultures. Give fluid bolus and start rocephin/flagyl. For now patient with SIRS with unknown source, pos intra-abdominal?. Will obtain CXR to evaluate for other possible source of infection. She has no rash to her abdomen, no shingles. She is HDS. Will repeat analgesics. Given fever pt may require admission. SIRS without identified source, intractable abdominal pain. Care signed out to incoming EDP DR [facility] pending remainder of labs and final disposition. ? ? ?CRITICAL CARE ?Performed by: Sloan Leiter ? ? ?Total critical care time: 79 minutes ? ?Critical care time was exclusive of separately billable procedures and treating other patients. ? ?Critical care was necessary to treat or prevent imminent or life-threatening deterioration. ? ?Critical care was time spent personally by me on the following activities: development of treatment plan with patient and/or surrogate as well as nursing, discussions with consultants, evaluation of patient's response to treatment, examination of patient, obtaining history from patient or surrogate, ordering and performing treatments and interventions, ordering and review of laboratory studies, ordering and review of radiographic studies, pulse oximetry and  re-evaluation of patient's condition. ?  ?  ?Sloan Leiter, DO ?03/18/22 2359 ? ?

## 2022-03-18 NOTE — ED Notes (Signed)
Pt crying out again in pain, EDP made aware ?

## 2022-03-19 LAB — CBG MONITORING, ED: Glucose-Capillary: 254 mg/dL — ABNORMAL HIGH (ref 70–99)

## 2022-03-19 MED ORDER — TRAMADOL HCL 50 MG PO TABS: 50.0000 mg | ORAL_TABLET | Freq: Four times a day (QID) | ORAL | 0 refills | Status: AC | PRN

## 2022-03-19 MED ORDER — ONDANSETRON HCL 4 MG PO TABS: 4.0000 mg | ORAL_TABLET | Freq: Four times a day (QID) | ORAL | 0 refills | Status: AC | PRN

## 2022-03-20 ENCOUNTER — Telehealth (HOSPITAL_BASED_OUTPATIENT_CLINIC_OR_DEPARTMENT_OTHER): Payer: Self-pay

## 2022-03-20 LAB — BLOOD CULTURE ID PANEL (REFLEXED) - BCID2

## 2022-03-20 NOTE — Telephone Encounter (Signed)
This RN attempted to call patient in regards to positive blood culture to recommend follow up at ED per Trifan MD. No answer with full voicemail. Will continue to try to reach patient.  ?

## 2022-03-20 NOTE — Telephone Encounter (Signed)
This RN attempted to call patient again for blood culture follow up with no answer.  ?

## 2022-03-22 LAB — CULTURE, BLOOD (ROUTINE X 2)

## 2022-03-24 LAB — CULTURE, BLOOD (ROUTINE X 2): Culture: NO GROWTH

## 2022-08-25 ENCOUNTER — Emergency Department (HOSPITAL_COMMUNITY)
Admission: EM | Admit: 2022-08-25 | Discharge: 2022-08-26 | Disposition: A | Discharge status: Other Acute Inpatient Hospital | Payer: BC Managed Care – PPO | Attending: Emergency Medicine | Admitting: Emergency Medicine

## 2022-08-25 DIAGNOSIS — Z79899 Other long term (current) drug therapy: Secondary | ICD-10-CM | POA: Diagnosis not present

## 2022-08-25 DIAGNOSIS — H40211 Acute angle-closure glaucoma, right eye: Secondary | ICD-10-CM | POA: Insufficient documentation

## 2022-08-25 DIAGNOSIS — H409 Unspecified glaucoma: Secondary | ICD-10-CM

## 2022-08-25 LAB — CBC WITH DIFFERENTIAL/PLATELET
Abs Immature Granulocytes: 0.04 10*3/uL (ref 0.00–0.07)
Basophils Absolute: 0.1 10*3/uL (ref 0.0–0.1)
Basophils Relative: 1 %
Eosinophils Absolute: 0.1 10*3/uL (ref 0.0–0.5)
Eosinophils Relative: 1 %
HCT: 40.6 % (ref 36.0–46.0)
Hemoglobin: 14.3 g/dL (ref 12.0–15.0)
Immature Granulocytes: 1 %
Lymphocytes Relative: 19 %
Lymphs Abs: 1.6 10*3/uL (ref 0.7–4.0)
MCH: 29.2 pg (ref 26.0–34.0)
MCHC: 35.2 g/dL (ref 30.0–36.0)
MCV: 82.9 fL (ref 80.0–100.0)
Monocytes Absolute: 0.7 10*3/uL (ref 0.1–1.0)
Monocytes Relative: 9 %
Neutro Abs: 5.8 10*3/uL (ref 1.7–7.7)
Neutrophils Relative %: 69 %
Platelets: 268 10*3/uL (ref 150–400)
RBC: 4.9 MIL/uL (ref 3.87–5.11)
RDW: 12.9 % (ref 11.5–15.5)
WBC: 8.3 10*3/uL (ref 4.0–10.5)
nRBC: 0 % (ref 0.0–0.2)

## 2022-08-25 LAB — I-STAT BETA HCG BLOOD, ED (MC, WL, AP ONLY): I-stat hCG, quantitative: <5 m[IU]/mL (ref <5)

## 2022-08-25 LAB — BASIC METABOLIC PANEL
Anion gap: 11 (ref 5–15)
BUN: 13 mg/dL (ref 6–20)
CO2: 18 mmol/L — ABNORMAL LOW (ref 22–32)
Calcium: 9 mg/dL (ref 8.9–10.3)
Chloride: 104 mmol/L (ref 98–111)
Creatinine, Ser: 0.61 mg/dL (ref 0.44–1.00)
GFR, Estimated: >60 mL/min (ref >60)
Glucose, Bld: 346 mg/dL — ABNORMAL HIGH (ref 70–99)
Potassium: 3.8 mmol/L (ref 3.5–5.1)
Sodium: 133 mmol/L — ABNORMAL LOW (ref 135–145)

## 2022-08-25 LAB — C-REACTIVE PROTEIN: CRP: 7.8 mg/dL — ABNORMAL HIGH (ref <1.0)

## 2022-08-25 LAB — SEDIMENTATION RATE: Sed Rate: 11 mm/hr (ref 0–22)

## 2022-08-25 MED ORDER — FENTANYL CITRATE PF 50 MCG/ML IJ SOSY
50.0000 ug | PREFILLED_SYRINGE | Freq: Once | INTRAMUSCULAR | Status: AC
  Administered 2022-08-25: 50 ug via INTRAVENOUS
  Filled 2022-08-25: qty 1

## 2022-08-25 MED ORDER — TIMOLOL MALEATE 0.5 % OP SOLN
1.0000 [drp] | Freq: Once | OPHTHALMIC | Status: AC
  Administered 2022-08-25: 1 [drp] via OPHTHALMIC
  Filled 2022-08-25: qty 5

## 2022-08-25 MED ORDER — BRIMONIDINE TARTRATE 0.15 % OP SOLN
1.0000 [drp] | Freq: Once | OPHTHALMIC | Status: AC
  Administered 2022-08-25: 1 [drp] via OPHTHALMIC
  Filled 2022-08-25: qty 5

## 2022-08-25 MED ORDER — TETRACAINE HCL 0.5 % OP SOLN
2.0000 [drp] | Freq: Once | OPHTHALMIC | Status: DC
  Filled 2022-08-25: qty 4

## 2022-08-25 MED ORDER — DORZOLAMIDE HCL 2 % OP SOLN
1.0000 [drp] | Freq: Once | OPHTHALMIC | Status: AC
  Administered 2022-08-25: 1 [drp] via OPHTHALMIC
  Filled 2022-08-25: qty 10

## 2022-08-25 MED ORDER — HYDROMORPHONE HCL 1 MG/ML IJ SOLN
1.0000 mg | Freq: Once | INTRAMUSCULAR | Status: AC
  Administered 2022-08-25: 1 mg via INTRAVENOUS
  Filled 2022-08-25: qty 1

## 2022-08-25 MED ORDER — TETRACAINE HCL 0.5 % OP SOLN: 2.0000 [drp] | Freq: Once | OPHTHALMIC | Status: DC

## 2022-08-25 NOTE — ED Provider Notes (Signed)
[facility] EMERGENCY DEPARTMENT Provider Note   CSN: [id number] Arrival date & time: 08/25/22  1851     History  No chief complaint on file.   April Chan is a 41 y.o. female. Presenting due to right eye pain.  Patient had a surgical fixation of retinal detachment 10 days ago.  She has had persistent swelling since that time.  She initially had improvement of her pain and over the past 2 days her pain has worsened and today it became intolerable.  Reports vision has slightly worsened during this time, and she is only able to see light and motion.  Reports photophobia. She has been compliant with her postoperative instructions including only lying on her left side.  She denies any new trauma to the eye. HPI     Home Medications Prior to Admission medications   Medication Sig Start Date End Date Taking? Authorizing Provider  amoxicillin (AMOXIL) 500 MG capsule Take 1 capsule (500 mg total) by mouth 3 (three) times daily. 02/12/16   Tharon Aquas, PA  bacitracin ointment Apply 1 application topically 2 (two) times daily. 08/23/21   Muthersbaugh, Dahlia Client, PA-C  BAYER CONTOUR TEST test strip TEST BLOOD SUGARS 4 TIMES DAILY 02/08/15   Brock Bad, MD  cyclobenzaprine (FLEXERIL) 10 MG tablet Take 1 tablet (10 mg total) by mouth at bedtime as needed for muscle spasms. 10/02/17   Robinson, [place] N, PA-C  glyBURIDE (DIABETA) 2.5 MG tablet Take 2 tablets (5 mg total) by mouth 2 (two) times daily with a meal. Patient not taking: Reported on 11/01/2015 09/05/15   Levie Heritage, DO  ibuprofen (ADVIL) 600 MG tablet Take 1 tablet (600 mg total) by mouth every 6 (six) hours as needed. 08/23/21   Muthersbaugh, Dahlia Client, PA-C  metFORMIN (GLUCOPHAGE) 500 MG tablet Take 1 tablet (500 mg total) by mouth 2 (two) times daily with a meal. Patient taking differently: Take 500 mg by mouth 2 (two) times daily with a meal. Two in the morning and one at night 06/28/15   Karim-Rhoades, Walidah N,  CNM  methocarbamol (ROBAXIN) 500 MG tablet Take 1 tablet (500 mg total) by mouth 2 (two) times daily. 08/23/21   Muthersbaugh, Dahlia Client, PA-C  metoCLOPramide (REGLAN) 10 MG tablet Take 1 tablet (10 mg total) by mouth 4 (four) times daily. Patient not taking: Reported on 11/01/2015 09/05/15   Levie Heritage, DO  omeprazole (PRILOSEC) 20 MG capsule TAKE 1 CAPSULE (20 MG TOTAL) BY MOUTH AT BEDTIME. 10/02/14   Brock Bad, MD  ondansetron (ZOFRAN) 4 MG tablet Take 1 tablet (4 mg total) by mouth every 6 (six) hours as needed for nausea. 03/19/22   Dione Booze, MD  oxyCODONE-acetaminophen (PERCOCET/ROXICET) 5-325 MG tablet Take 2 tablets by mouth every 4 (four) hours as needed (for pain scale greater than 7). Patient not taking: Reported on 11/01/2015 10/01/15   Tereso Newcomer, MD  Prenatal Vit-Fe Fumarate-FA (VITAFOL-OB) TABS Take 1 tablet by mouth daily before breakfast. 07/17/14   Brock Bad, MD  sertraline (ZOLOFT) 50 MG tablet Take 1 tablet (50 mg total) by mouth daily. Patient not taking: Reported on 11/01/2015 09/24/15   Levie Heritage, DO  traMADol (ULTRAM) 50 MG tablet Take 1 tablet (50 mg total) by mouth every 6 (six) hours as needed. 03/19/22   Dione Booze, MD  valACYclovir (VALTREX) 500 MG tablet TAKE 1 TABLET BY MOUTH EVERY DAY Patient not taking: Reported on 11/01/2015 09/01/15   Coral Ceo  A, MD      Allergies    Patient has no known allergies.    Review of Systems   Review of Systems  Constitutional:  Negative for chills and fever.  HENT:  Negative for ear pain and sore throat.   Eyes:  Positive for photophobia, pain, discharge, redness and visual disturbance.  Respiratory:  Negative for cough and shortness of breath.   Cardiovascular:  Negative for chest pain and palpitations.  Gastrointestinal:  Negative for abdominal pain and vomiting.  Genitourinary:  Negative for dysuria and hematuria.  Musculoskeletal:  Negative for arthralgias and back pain.  Skin:   Negative for color change and rash.  Neurological:  Negative for seizures and syncope.  All other systems reviewed and are negative.   Physical Exam Updated Vital Signs BP (!) 152/97 (BP Location: Right Arm)   Pulse 86   Temp 98.8 F (37.1 C) (Oral)   Resp 17   SpO2 99%  Physical Exam Vitals and nursing note reviewed.  Constitutional:      General: She is not in acute distress.    Appearance: She is well-developed.  HENT:     Head: Normocephalic and atraumatic.  Eyes:     Comments: R eye: See photos below Able to sense light and motion only Extraocular movements present, but decreased throughout Pupil 6 mm, nonreactive Diffuse conjunctival injection Firm to palpation IOP 44  L eye: no conjunctival injection, extraocular movements intact, vision at baseline  Cardiovascular:     Rate and Rhythm: Normal rate and regular rhythm.     Heart sounds: No murmur heard. Pulmonary:     Effort: Pulmonary effort is normal. No respiratory distress.     Breath sounds: Normal breath sounds.  Abdominal:     Palpations: Abdomen is soft.     Tenderness: There is no abdominal tenderness.  Musculoskeletal:        General: No swelling.     Cervical back: Neck supple.  Skin:    General: Skin is warm and dry.     Capillary Refill: Capillary refill takes less than 2 seconds.  Neurological:     Mental Status: She is alert.  Psychiatric:        Mood and Affect: Mood normal.        ED Results / Procedures / Treatments   Labs (all labs ordered are listed, but only abnormal results are displayed) Labs Reviewed  BASIC METABOLIC PANEL - Abnormal; Notable for the following components:      Result Value   Sodium 133 (*)    CO2 18 (*)    Glucose, Bld 346 (*)    All other components within normal limits  C-REACTIVE PROTEIN - Abnormal; Notable for the following components:   CRP 7.8 (*)    All other components within normal limits  CBC WITH DIFFERENTIAL/PLATELET  SEDIMENTATION RATE   I-STAT BETA HCG BLOOD, ED ([facility], [facility], [facility] ONLY)    EKG None  Radiology No results found.  Procedures Procedures    Medications Ordered in ED Medications  tetracaine (PONTOCAINE) 0.5 % ophthalmic solution 2 drop (2 drops Right Eye Not Given 08/25/22 2247)  fentaNYL (SUBLIMAZE) injection 50 mcg (50 mcg Intravenous Given 08/25/22 2256)  timolol (TIMOPTIC) 0.5 % ophthalmic solution 1 drop (1 drop Right Eye Given 08/25/22 2358)  brimonidine (ALPHAGAN) 0.15 % ophthalmic solution 1 drop (1 drop Right Eye Given 08/25/22 2359)  dorzolamide (TRUSOPT) 2 % ophthalmic solution 1 drop (1 drop Right Eye Given 08/25/22 2358)  HYDROmorphone (DILAUDID) injection 1 mg (1 mg Intravenous Given 08/25/22 2351)    ED Course/ Medical Decision Making/ A&P                           Medical Decision Making Risk Prescription drug management.   41 year old female with past medical history of diabetes and recent retinal detachment repair 10 days ago presenting with worsening right eye pain and swelling over the past 2 days.  Differential diagnosis includes postoperative complication, glaucoma, increased IOP, infection, retrobulbar hemorrhage.  Physical exam concerning including increased IOP to 43, along with severe erythema, pain, and swelling to the right eye. Labs overall reassuring, no leukocytosis and minimal elevation of CRP.   Discussed case with ophthalmology who recommended transfer to [facility], as this is where patient's procedure was performed.  Discussed with [facility] ophthalmology who agreed with transfer.  Concern for possible glaucoma.They also recommended multiple eye drops, including timolol, dorzolamide, and brimonidine. These were given. Patient also given fentanyl, then dilaudid for additional pain control.  Patient prepared for discharge to [facility] ED.         Final Clinical Impression(s) / ED Diagnoses Final diagnoses:  Acute glaucoma of right eye    Rx / DC Orders ED  Discharge Orders     None         Kela Millin, MD 08/26/22 Marlyne Beards    Pricilla Loveless, MD 08/26/22 [phone]

## 2022-08-25 NOTE — ED Provider Notes (Signed)
I discussed treatment plan and transfer with [facility] ophthalmology, Dr. Arne Cleveland. Her IOP is 44. He recommends:  Timolol 1 drop Brimonidine 1 drop Dorzolamide 1 drop  He accepts in transfer to [facility] ED. No imaging desired by ophthalmology.    Pricilla Loveless, MD 08/25/22 [phone]

## 2022-08-25 NOTE — ED Provider Triage Note (Signed)
Emergency Medicine Provider Triage Evaluation Note  April Chan , a 41 y.o. female  was evaluated in triage.  Pt complains of right eye pain and swelling.  Retinal attachment 1 week ago, seen by ophthalmology and had surgery.  She has been having worsening pain since the surgery which acutely worsened today.  Also endorses flashing lights today which is new.  Her eye is swollen more than baseline, unable to open.  Review of Systems  Per HPI  Physical Exam  BP (!) 137/94 (BP Location: Right Arm)   Pulse 90   Temp 98.7 F (37.1 C) (Oral)   Resp 18   SpO2 99%  Gen:   Awake, wailing/moaning Resp:  Normal effort  MSK:   Moves extremities without difficulty  Other:  Right eye swollen, contusion periorbitally.  Unable to do EOMs due to amount of swelling.  Medical Decision Making  Medically screening exam initiated at 7:03 PM.  Appropriate orders placed.  Elliot Gurney was informed that the remainder of the evaluation will be completed by another provider, this initial triage assessment does not replace that evaluation, and the importance of remaining in the ED until their evaluation is complete.  Discussed with my attending over the phone, Dr. Oswald Hillock advised basic labs, ESR and CRP patient to be next room   Sherrill Raring, [place] 08/25/22 1904

## 2022-08-25 NOTE — ED Triage Notes (Signed)
Pt w reccurant swelling to R eye after retinal sx 1wk ago. Swelling & pain first noticed yesterday Describes pain as pressure, "goes all the way down neck." Seen in office Tuesday for follow up, advised of proper healing, but states she started seeing "flashes of light" today. States she rolled over in bed & "was laying on it," unsure if related.

## 2022-08-26 NOTE — ED Notes (Signed)
Report given to [facility] at [facility] ED.

## 2022-08-26 NOTE — ED Notes (Signed)
Gave report to [facility] transport

## 2024-09-05 ENCOUNTER — Other Ambulatory Visit: Payer: Self-pay

## 2024-09-05 ENCOUNTER — Encounter (HOSPITAL_COMMUNITY): Payer: Self-pay

## 2024-09-05 ENCOUNTER — Emergency Department (HOSPITAL_COMMUNITY)
Admission: EM | Admit: 2024-09-05 | Discharge: 2024-09-05 | Disposition: A | Discharge status: Home / Self Care | Attending: Emergency Medicine | Admitting: Emergency Medicine

## 2024-09-05 DIAGNOSIS — R000 Tachycardia, unspecified: Secondary | ICD-10-CM | POA: Diagnosis not present

## 2024-09-05 DIAGNOSIS — S01512A Laceration without foreign body of oral cavity, initial encounter: Secondary | ICD-10-CM | POA: Diagnosis present

## 2024-09-05 DIAGNOSIS — S1081XA Abrasion of other specified part of neck, initial encounter: Secondary | ICD-10-CM | POA: Insufficient documentation

## 2024-09-05 MED ORDER — AMOXICILLIN 500 MG PO CAPS: 500.0000 mg | ORAL_CAPSULE | Freq: Three times a day (TID) | ORAL | 0 refills | Status: AC

## 2024-09-05 MED ORDER — AMOXICILLIN 500 MG PO CAPS
500.0000 mg | ORAL_CAPSULE | Freq: Once | ORAL | Status: AC
  Administered 2024-09-05: 500 mg via ORAL
  Filled 2024-09-05: qty 1

## 2024-09-05 MED ORDER — LIDOCAINE HCL (PF) 1 % IJ SOLN
30.0000 mL | Freq: Once | INTRAMUSCULAR | Status: AC
  Administered 2024-09-05: 30 mL
  Filled 2024-09-05: qty 30

## 2024-09-05 MED ORDER — ACETAMINOPHEN 500 MG PO TABS
1000.0000 mg | ORAL_TABLET | Freq: Once | ORAL | Status: AC
  Administered 2024-09-05: 1000 mg via ORAL
  Filled 2024-09-05: qty 2

## 2024-09-05 MED ORDER — LIDOCAINE VISCOUS HCL 2 % MT SOLN
15.0000 mL | Freq: Once | OROMUCOSAL | Status: DC
  Filled 2024-09-05: qty 15
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
